# Patient Record
Sex: Male | Born: 1975 | Race: Black or African American | Hispanic: No | Marital: Single | Smoking: Current every day smoker
Health system: Southern US, Community
[De-identification: ages and names within clinical notes are randomized; demographics above are authoritative.]

## PROBLEM LIST (undated history)

## (undated) DIAGNOSIS — E119 Type 2 diabetes mellitus without complications: Secondary | ICD-10-CM

## (undated) DIAGNOSIS — J45909 Unspecified asthma, uncomplicated: Secondary | ICD-10-CM

## (undated) HISTORY — DX: Unspecified asthma, uncomplicated: J45.909

## (undated) HISTORY — DX: Type 2 diabetes mellitus without complications: E11.9

---

## 1998-10-23 ENCOUNTER — Emergency Department (HOSPITAL_COMMUNITY): Admission: EM | Admit: 1998-10-23 | Discharge: 1998-10-23 | Payer: Self-pay | Admitting: Emergency Medicine

## 1998-10-24 ENCOUNTER — Encounter: Payer: Self-pay | Admitting: Internal Medicine

## 2001-08-16 ENCOUNTER — Emergency Department (HOSPITAL_COMMUNITY): Admission: EM | Admit: 2001-08-16 | Discharge: 2001-08-16 | Payer: Self-pay

## 2014-01-02 ENCOUNTER — Telehealth: Payer: Self-pay | Admitting: Internal Medicine

## 2014-01-02 NOTE — Telephone Encounter (Signed)
Marteta Dick's son Shari Prowsvan and Waldemar DickensVan Featherly [phone], Van ClinesMarteta stated that she spoke with you and you agree to take both of her son to be new pt. Please verify this? Please call Marteta back ,

## 2014-01-03 NOTE — Telephone Encounter (Signed)
Ok Thx 

## 2014-01-08 NOTE — Telephone Encounter (Signed)
appt is set for both patient.

## 2014-03-24 ENCOUNTER — Other Ambulatory Visit (INDEPENDENT_AMBULATORY_CARE_PROVIDER_SITE_OTHER): Payer: 59

## 2014-03-24 ENCOUNTER — Ambulatory Visit (INDEPENDENT_AMBULATORY_CARE_PROVIDER_SITE_OTHER): Payer: 59 | Admitting: Internal Medicine

## 2014-03-24 ENCOUNTER — Encounter: Payer: Self-pay | Admitting: Internal Medicine

## 2014-03-24 ENCOUNTER — Telehealth: Payer: Self-pay | Admitting: Internal Medicine

## 2014-03-24 VITALS — BP 130/86 | HR 69 | Temp 98.3°F | Ht 72.0 in | Wt 255.0 lb

## 2014-03-24 DIAGNOSIS — R1013 Epigastric pain: Secondary | ICD-10-CM

## 2014-03-24 DIAGNOSIS — F17200 Nicotine dependence, unspecified, uncomplicated: Secondary | ICD-10-CM | POA: Insufficient documentation

## 2014-03-24 DIAGNOSIS — Z23 Encounter for immunization: Secondary | ICD-10-CM

## 2014-03-24 DIAGNOSIS — Z0000 Encounter for general adult medical examination without abnormal findings: Secondary | ICD-10-CM

## 2014-03-24 DIAGNOSIS — Z720 Tobacco use: Secondary | ICD-10-CM

## 2014-03-24 DIAGNOSIS — K30 Functional dyspepsia: Secondary | ICD-10-CM | POA: Insufficient documentation

## 2014-03-24 LAB — CBC WITH DIFFERENTIAL/PLATELET
Basophils Absolute: 0 10*3/uL (ref 0.0–0.1)
Basophils Relative: 0.5 % (ref 0.0–3.0)
EOS PCT: 5.4 % — AB (ref 0.0–5.0)
Eosinophils Absolute: 0.5 10*3/uL (ref 0.0–0.7)
HCT: 47.9 % (ref 39.0–52.0)
Hemoglobin: 15.7 g/dL (ref 13.0–17.0)
Lymphocytes Relative: 29 % (ref 12.0–46.0)
Lymphs Abs: 2.5 10*3/uL (ref 0.7–4.0)
MCHC: 32.8 g/dL (ref 30.0–36.0)
MCV: 80.9 fl (ref 78.0–100.0)
MONO ABS: 0.6 10*3/uL (ref 0.1–1.0)
Monocytes Relative: 7.3 % (ref 3.0–12.0)
NEUTROS PCT: 57.8 % (ref 43.0–77.0)
Neutro Abs: 5 10*3/uL (ref 1.4–7.7)
PLATELETS: 225 10*3/uL (ref 150.0–400.0)
RBC: 5.92 Mil/uL — AB (ref 4.22–5.81)
RDW: 12.6 % (ref 11.5–15.5)
WBC: 8.6 10*3/uL (ref 4.0–10.5)

## 2014-03-24 LAB — HEPATIC FUNCTION PANEL
ALT: 62 U/L — ABNORMAL HIGH (ref 0–53)
AST: 22 U/L (ref 0–37)
Albumin: 4.5 g/dL (ref 3.5–5.2)
Alkaline Phosphatase: 66 U/L (ref 39–117)
BILIRUBIN TOTAL: 0.4 mg/dL (ref 0.2–1.2)
Bilirubin, Direct: 0.1 mg/dL (ref 0.0–0.3)
Total Protein: 7.2 g/dL (ref 6.0–8.3)

## 2014-03-24 LAB — LIPID PANEL
Cholesterol: 174 mg/dL (ref 0–200)
HDL: 41 mg/dL (ref >39.00)
LDL Cholesterol: 99 mg/dL (ref 0–99)
NonHDL: 133
Total CHOL/HDL Ratio: 4
Triglycerides: 170 mg/dL — ABNORMAL HIGH (ref 0.0–149.0)
VLDL: 34 mg/dL (ref 0.0–40.0)

## 2014-03-24 LAB — URINALYSIS
Bilirubin Urine: NEGATIVE
HGB URINE DIPSTICK: NEGATIVE
Ketones, ur: NEGATIVE
LEUKOCYTES UA: NEGATIVE
Nitrite: NEGATIVE
SPECIFIC GRAVITY, URINE: 1.025 (ref 1.000–1.030)
Total Protein, Urine: NEGATIVE
Urine Glucose: NEGATIVE
Urobilinogen, UA: 0.2 (ref 0.0–1.0)
pH: 6.5 (ref 5.0–8.0)

## 2014-03-24 LAB — BASIC METABOLIC PANEL
BUN: 16 mg/dL (ref 6–23)
CO2: 28 mEq/L (ref 19–32)
Calcium: 9.7 mg/dL (ref 8.4–10.5)
Chloride: 104 mEq/L (ref 96–112)
Creatinine, Ser: 1.05 mg/dL (ref 0.40–1.50)
GFR: 100.61 mL/min (ref >60.00)
Glucose, Bld: 95 mg/dL (ref 70–99)
Potassium: 3.9 mEq/L (ref 3.5–5.1)
Sodium: 140 mEq/L (ref 135–145)

## 2014-03-24 LAB — H. PYLORI ANTIBODY, IGG: H Pylori IgG: NEGATIVE

## 2014-03-24 LAB — PSA: PSA: 0.65 ng/mL (ref 0.10–4.00)

## 2014-03-24 LAB — TSH: TSH: 1.7 u[IU]/mL (ref 0.35–4.50)

## 2014-03-24 MED ORDER — VITAMIN D 1000 UNITS PO TABS: 1000.0000 [IU] | ORAL_TABLET | Freq: Every day | ORAL | Status: AC

## 2014-03-24 NOTE — Telephone Encounter (Signed)
emmi mailed  °

## 2014-03-24 NOTE — Assessment & Plan Note (Signed)
Discussed.

## 2014-03-24 NOTE — Assessment & Plan Note (Signed)
H pylori

## 2014-03-24 NOTE — Patient Instructions (Signed)
Use OTC Prilosec for indigestion

## 2014-03-24 NOTE — Progress Notes (Signed)
Pre visit review using our clinic review tool, if applicable. No additional management support is needed unless otherwise documented below in the visit note. 

## 2014-03-24 NOTE — Assessment & Plan Note (Signed)
We discussed age appropriate health related issues, including available/recomended screening tests and vaccinations. We discussed a need for adhering to healthy diet and exercise. Labs/EKG were reviewed/ordered. All questions were answered.   

## 2015-03-27 ENCOUNTER — Other Ambulatory Visit (INDEPENDENT_AMBULATORY_CARE_PROVIDER_SITE_OTHER): Payer: 59

## 2015-03-27 ENCOUNTER — Encounter: Payer: Self-pay | Admitting: Internal Medicine

## 2015-03-27 ENCOUNTER — Ambulatory Visit (INDEPENDENT_AMBULATORY_CARE_PROVIDER_SITE_OTHER): Payer: 59 | Admitting: Internal Medicine

## 2015-03-27 VITALS — BP 112/82 | HR 85 | Ht 72.0 in | Wt 266.0 lb

## 2015-03-27 DIAGNOSIS — F17200 Nicotine dependence, unspecified, uncomplicated: Secondary | ICD-10-CM | POA: Diagnosis not present

## 2015-03-27 DIAGNOSIS — Z0000 Encounter for general adult medical examination without abnormal findings: Secondary | ICD-10-CM

## 2015-03-27 LAB — BASIC METABOLIC PANEL
BUN: 14 mg/dL (ref 6–23)
CALCIUM: 9.8 mg/dL (ref 8.4–10.5)
CHLORIDE: 103 meq/L (ref 96–112)
CO2: 28 meq/L (ref 19–32)
Creatinine, Ser: 1.15 mg/dL (ref 0.40–1.50)
GFR: 90.13 mL/min (ref >60.00)
Glucose, Bld: 107 mg/dL — ABNORMAL HIGH (ref 70–99)
Potassium: 4.6 mEq/L (ref 3.5–5.1)
SODIUM: 140 meq/L (ref 135–145)

## 2015-03-27 LAB — CBC WITH DIFFERENTIAL/PLATELET
BASOS ABS: 0 10*3/uL (ref 0.0–0.1)
BASOS PCT: 0.5 % (ref 0.0–3.0)
Eosinophils Absolute: 0.5 10*3/uL (ref 0.0–0.7)
Eosinophils Relative: 4.8 % (ref 0.0–5.0)
HEMATOCRIT: 49.5 % (ref 39.0–52.0)
Hemoglobin: 16.3 g/dL (ref 13.0–17.0)
LYMPHS PCT: 25 % (ref 12.0–46.0)
Lymphs Abs: 2.3 10*3/uL (ref 0.7–4.0)
MCHC: 32.8 g/dL (ref 30.0–36.0)
MCV: 80.1 fl (ref 78.0–100.0)
MONOS PCT: 7.9 % (ref 3.0–12.0)
Monocytes Absolute: 0.7 10*3/uL (ref 0.1–1.0)
NEUTROS ABS: 5.8 10*3/uL (ref 1.4–7.7)
Neutrophils Relative %: 61.8 % (ref 43.0–77.0)
PLATELETS: 243 10*3/uL (ref 150.0–400.0)
RBC: 6.19 Mil/uL — ABNORMAL HIGH (ref 4.22–5.81)
RDW: 13.1 % (ref 11.5–15.5)
WBC: 9.4 10*3/uL (ref 4.0–10.5)

## 2015-03-27 LAB — URINALYSIS
Bilirubin Urine: NEGATIVE
Hgb urine dipstick: NEGATIVE
Ketones, ur: NEGATIVE
Leukocytes, UA: NEGATIVE
NITRITE: NEGATIVE
SPECIFIC GRAVITY, URINE: 1.025 (ref 1.000–1.030)
Total Protein, Urine: NEGATIVE
URINE GLUCOSE: NEGATIVE
UROBILINOGEN UA: 0.2 (ref 0.0–1.0)
pH: 6 (ref 5.0–8.0)

## 2015-03-27 LAB — HEPATIC FUNCTION PANEL
ALBUMIN: 4.5 g/dL (ref 3.5–5.2)
ALT: 46 U/L (ref 0–53)
AST: 22 U/L (ref 0–37)
Alkaline Phosphatase: 67 U/L (ref 39–117)
Bilirubin, Direct: 0.1 mg/dL (ref 0.0–0.3)
Total Bilirubin: 0.4 mg/dL (ref 0.2–1.2)
Total Protein: 7.4 g/dL (ref 6.0–8.3)

## 2015-03-27 LAB — LIPID PANEL
CHOLESTEROL: 209 mg/dL — AB (ref 0–200)
HDL: 45.9 mg/dL (ref >39.00)
LDL Cholesterol: 134 mg/dL — ABNORMAL HIGH (ref 0–99)
NonHDL: 163.44
TRIGLYCERIDES: 148 mg/dL (ref 0.0–149.0)
Total CHOL/HDL Ratio: 5
VLDL: 29.6 mg/dL (ref 0.0–40.0)

## 2015-03-27 LAB — TSH: TSH: 1.17 u[IU]/mL (ref 0.35–4.50)

## 2015-03-27 LAB — PSA: PSA: 0.66 ng/mL (ref 0.10–4.00)

## 2015-03-27 MED ORDER — VITAMIN D3 50 MCG (2000 UT) PO CAPS: 2000.0000 [IU] | ORAL_CAPSULE | Freq: Every day | ORAL | Status: AC

## 2015-03-27 NOTE — Progress Notes (Signed)
Pre visit review using our clinic review tool, if applicable. No additional management support is needed unless otherwise documented below in the visit note. 

## 2015-03-27 NOTE — Progress Notes (Signed)
Subjective:  Patient ID: Levi Burns, male    DOB: 11-02-1975  Age: 40 y.o. MRN: [id number]  CC: Annual Exam   HPI Levi Burns presents for well exam  No outpatient prescriptions prior to visit.   No facility-administered medications prior to visit.    ROS Review of Systems  Constitutional: Negative for appetite change, fatigue and unexpected weight change.  HENT: Negative for congestion, nosebleeds, sneezing, sore throat and trouble swallowing.   Eyes: Negative for itching and visual disturbance.  Respiratory: Negative for cough.   Cardiovascular: Negative for chest pain, palpitations and leg swelling.  Gastrointestinal: Negative for nausea, diarrhea, blood in stool and abdominal distention.  Genitourinary: Negative for frequency and hematuria.  Musculoskeletal: Negative for back pain, joint swelling, gait problem and neck pain.  Skin: Negative for rash.  Neurological: Negative for dizziness, tremors, speech difficulty and weakness.  Psychiatric/Behavioral: Negative for suicidal ideas, sleep disturbance, dysphoric mood and agitation. The patient is not nervous/anxious.     Objective:  BP 112/82 mmHg  Pulse 85  Ht 6' (1.829 m)  Wt 266 lb (120.657 kg)  BMI 36.07 kg/m2  SpO2 97%  BP Readings from Last 3 Encounters:  03/27/15 112/82  03/24/14 130/86    Wt Readings from Last 3 Encounters:  03/27/15 266 lb (120.657 kg)  03/24/14 255 lb (115.667 kg)    Physical Exam  Constitutional: He is oriented to person, place, and time. He appears well-developed and well-nourished. No distress.  HENT:  Head: Normocephalic and atraumatic.  Right Ear: External ear normal.  Left Ear: External ear normal.  Nose: Nose normal.  Mouth/Throat: Oropharynx is clear and moist. No oropharyngeal exudate.  Eyes: Conjunctivae and EOM are normal. Pupils are equal, round, and reactive to light. Right eye exhibits no discharge. Left eye exhibits no discharge. No scleral icterus.  Neck: Normal  range of motion. Neck supple. No JVD present. No tracheal deviation present. No thyromegaly present.  Cardiovascular: Normal rate, regular rhythm, normal heart sounds and intact distal pulses.  Exam reveals no gallop and no friction rub.   No murmur heard. Pulmonary/Chest: Effort normal and breath sounds normal. No stridor. No respiratory distress. He has no wheezes. He has no rales. He exhibits no tenderness.  Abdominal: Soft. Bowel sounds are normal. He exhibits no distension and no mass. There is no tenderness. There is no rebound and no guarding.  Musculoskeletal: Normal range of motion. He exhibits no edema or tenderness.  Lymphadenopathy:    He has no cervical adenopathy.  Neurological: He is alert and oriented to person, place, and time. He has normal reflexes. No cranial nerve deficit. He exhibits normal muscle tone. Coordination normal.  Skin: Skin is warm and dry. No rash noted. He is not diaphoretic. No erythema. No pallor.  Psychiatric: He has a normal mood and affect. His behavior is normal. Judgment and thought content normal.    Lab Results  Component Value Date   WBC 8.6 03/24/2014   HGB 15.7 03/24/2014   HCT 47.9 03/24/2014   PLT 225.0 03/24/2014   GLUCOSE 95 03/24/2014   CHOL 174 03/24/2014   TRIG 170.0* 03/24/2014   HDL 41.00 03/24/2014   LDLCALC 99 03/24/2014   ALT 62* 03/24/2014   AST 22 03/24/2014   NA 140 03/24/2014   K 3.9 03/24/2014   CL 104 03/24/2014   CREATININE 1.05 03/24/2014   BUN 16 03/24/2014   CO2 28 03/24/2014   TSH 1.70 03/24/2014   PSA 0.65 03/24/2014  No results found.  Assessment & Plan:   There are no diagnoses linked to this encounter. Mr. Khim does not currently have medications on file.  No orders of the defined types were placed in this encounter.     Follow-up: No Follow-up on file.  Sonda Primes, MD

## 2015-03-27 NOTE — Assessment & Plan Note (Signed)
We discussed age appropriate health related issues, including available/recomended screening tests and vaccinations. We discussed a need for adhering to healthy diet and exercise. Labs/EKG were reviewed/ordered. All questions were answered.  Labs 

## 2015-03-27 NOTE — Assessment & Plan Note (Signed)
Discussed 1 ppd 

## 2015-03-28 LAB — HIV ANTIBODY (ROUTINE TESTING W REFLEX): HIV: NONREACTIVE

## 2016-03-28 ENCOUNTER — Encounter: Payer: Self-pay | Admitting: Internal Medicine

## 2016-03-28 ENCOUNTER — Ambulatory Visit (INDEPENDENT_AMBULATORY_CARE_PROVIDER_SITE_OTHER): Payer: 59 | Admitting: Internal Medicine

## 2016-03-28 ENCOUNTER — Other Ambulatory Visit (INDEPENDENT_AMBULATORY_CARE_PROVIDER_SITE_OTHER): Payer: 59

## 2016-03-28 VITALS — BP 120/80 | HR 66 | Ht 72.0 in | Wt 263.0 lb

## 2016-03-28 DIAGNOSIS — Z0000 Encounter for general adult medical examination without abnormal findings: Secondary | ICD-10-CM | POA: Diagnosis not present

## 2016-03-28 DIAGNOSIS — R972 Elevated prostate specific antigen [PSA]: Secondary | ICD-10-CM

## 2016-03-28 LAB — BASIC METABOLIC PANEL
BUN: 14 mg/dL (ref 6–23)
CHLORIDE: 106 meq/L (ref 96–112)
CO2: 27 meq/L (ref 19–32)
Calcium: 9.4 mg/dL (ref 8.4–10.5)
Creatinine, Ser: 1.13 mg/dL (ref 0.40–1.50)
GFR: 91.52 mL/min (ref >60.00)
GLUCOSE: 110 mg/dL — AB (ref 70–99)
POTASSIUM: 4.4 meq/L (ref 3.5–5.1)
Sodium: 141 mEq/L (ref 135–145)

## 2016-03-28 LAB — URINALYSIS
BILIRUBIN URINE: NEGATIVE
HGB URINE DIPSTICK: NEGATIVE
Ketones, ur: NEGATIVE
LEUKOCYTES UA: NEGATIVE
Nitrite: NEGATIVE
SPECIFIC GRAVITY, URINE: 1.02 (ref 1.000–1.030)
Total Protein, Urine: NEGATIVE
UROBILINOGEN UA: 0.2 (ref 0.0–1.0)
Urine Glucose: NEGATIVE
pH: 6 (ref 5.0–8.0)

## 2016-03-28 LAB — CBC WITH DIFFERENTIAL/PLATELET
BASOS PCT: 0.7 % (ref 0.0–3.0)
Basophils Absolute: 0.1 10*3/uL (ref 0.0–0.1)
EOS PCT: 4.9 % (ref 0.0–5.0)
Eosinophils Absolute: 0.4 10*3/uL (ref 0.0–0.7)
HCT: 45.9 % (ref 39.0–52.0)
HEMOGLOBIN: 15.6 g/dL (ref 13.0–17.0)
LYMPHS ABS: 2.6 10*3/uL (ref 0.7–4.0)
Lymphocytes Relative: 29 % (ref 12.0–46.0)
MCHC: 34.1 g/dL (ref 30.0–36.0)
MCV: 78.7 fl (ref 78.0–100.0)
Monocytes Absolute: 0.6 10*3/uL (ref 0.1–1.0)
Monocytes Relative: 6.9 % (ref 3.0–12.0)
NEUTROS PCT: 58.5 % (ref 43.0–77.0)
Neutro Abs: 5.3 10*3/uL (ref 1.4–7.7)
Platelets: 230 10*3/uL (ref 150.0–400.0)
RBC: 5.82 Mil/uL — ABNORMAL HIGH (ref 4.22–5.81)
RDW: 13.6 % (ref 11.5–15.5)
WBC: 9.1 10*3/uL (ref 4.0–10.5)

## 2016-03-28 LAB — HEPATIC FUNCTION PANEL
ALT: 41 U/L (ref 0–53)
AST: 20 U/L (ref 0–37)
Albumin: 4.3 g/dL (ref 3.5–5.2)
Alkaline Phosphatase: 66 U/L (ref 39–117)
BILIRUBIN TOTAL: 0.3 mg/dL (ref 0.2–1.2)
Bilirubin, Direct: 0.1 mg/dL (ref 0.0–0.3)
Total Protein: 7 g/dL (ref 6.0–8.3)

## 2016-03-28 LAB — PSA: PSA: 9.61 ng/mL — ABNORMAL HIGH (ref 0.10–4.00)

## 2016-03-28 LAB — LIPID PANEL
CHOLESTEROL: 181 mg/dL (ref 0–200)
HDL: 41.1 mg/dL (ref >39.00)
LDL CALC: 111 mg/dL — AB (ref 0–99)
NonHDL: 139.4
TRIGLYCERIDES: 143 mg/dL (ref 0.0–149.0)
Total CHOL/HDL Ratio: 4
VLDL: 28.6 mg/dL (ref 0.0–40.0)

## 2016-03-28 LAB — HIV ANTIBODY (ROUTINE TESTING W REFLEX): HIV: NONREACTIVE

## 2016-03-28 LAB — TSH: TSH: 1.11 u[IU]/mL (ref 0.35–4.50)

## 2016-03-28 NOTE — Progress Notes (Signed)
Pre visit review using our clinic review tool, if applicable. No additional management support is needed unless otherwise documented below in the visit note. 

## 2016-03-28 NOTE — Assessment & Plan Note (Signed)
We discussed age appropriate health related issues, including available/recomended screening tests and vaccinations. We discussed a need for adhering to healthy diet and exercise. Labs/EKG were reviewed/ordered. All questions were answered.   

## 2016-03-28 NOTE — Progress Notes (Signed)
Subjective:  Patient ID: Levi Burns, male    DOB: Mar 30, 1975  Age: 41 y.o. MRN: [id number]  CC: Annual Exam   HPI Levi Dronevan Roets presents for a well exam  Outpatient Medications Prior to Visit  Medication Sig Dispense Refill  . Cholecalciferol (VITAMIN D3) 2000 units capsule Take 1 capsule (2,000 Units total) by mouth daily. (Patient not taking: Reported on 03/28/2016) 100 capsule 3   No facility-administered medications prior to visit.     ROS Review of Systems  Constitutional: Negative for appetite change, fatigue and unexpected weight change.  HENT: Negative for congestion, nosebleeds, sneezing, sore throat and trouble swallowing.   Eyes: Negative for itching and visual disturbance.  Respiratory: Negative for cough.   Cardiovascular: Negative for chest pain, palpitations and leg swelling.  Gastrointestinal: Negative for abdominal distention, blood in stool, diarrhea and nausea.  Genitourinary: Negative for frequency and hematuria.  Musculoskeletal: Negative for back pain, gait problem, joint swelling and neck pain.  Skin: Negative for rash.  Neurological: Negative for dizziness, tremors, speech difficulty and weakness.  Psychiatric/Behavioral: Negative for agitation, dysphoric mood, sleep disturbance and suicidal ideas. The patient is not nervous/anxious.     Objective:  BP 120/80   Pulse 66   Ht 6' (1.829 m)   Wt 263 lb (119.3 kg)   SpO2 98%   BMI 35.67 kg/m   BP Readings from Last 3 Encounters:  03/28/16 120/80  03/27/15 112/82  03/24/14 130/86    Wt Readings from Last 3 Encounters:  03/28/16 263 lb (119.3 kg)  03/27/15 266 lb (120.7 kg)  03/24/14 255 lb (115.7 kg)    Physical Exam  Constitutional: He is oriented to person, place, and time. He appears well-developed. No distress.  NAD  HENT:  Mouth/Throat: Oropharynx is clear and moist.  Eyes: Conjunctivae are normal. Pupils are equal, round, and reactive to light.  Neck: Normal range of motion. No JVD  present. No thyromegaly present.  Cardiovascular: Normal rate, regular rhythm, normal heart sounds and intact distal pulses.  Exam reveals no gallop and no friction rub.   No murmur heard. Pulmonary/Chest: Effort normal and breath sounds normal. No respiratory distress. He has no wheezes. He has no rales. He exhibits no tenderness.  Abdominal: Soft. Bowel sounds are normal. He exhibits no distension and no mass. There is no tenderness. There is no rebound and no guarding.  Musculoskeletal: Normal range of motion. He exhibits no edema or tenderness.  Lymphadenopathy:    He has no cervical adenopathy.  Neurological: He is alert and oriented to person, place, and time. He has normal reflexes. No cranial nerve deficit. He exhibits normal muscle tone. He displays a negative Romberg sign. Coordination and gait normal.  Skin: Skin is warm and dry. No rash noted.  Psychiatric: He has a normal mood and affect. His behavior is normal. Judgment and thought content normal.  pt declined genital exam  Lab Results  Component Value Date   WBC 9.4 03/27/2015   HGB 16.3 03/27/2015   HCT 49.5 03/27/2015   PLT 243.0 03/27/2015   GLUCOSE 107 (H) 03/27/2015   CHOL 209 (H) 03/27/2015   TRIG 148.0 03/27/2015   HDL 45.90 03/27/2015   LDLCALC 134 (H) 03/27/2015   ALT 46 03/27/2015   AST 22 03/27/2015   NA 140 03/27/2015   K 4.6 03/27/2015   CL 103 03/27/2015   CREATININE 1.15 03/27/2015   BUN 14 03/27/2015   CO2 28 03/27/2015   TSH 1.17 03/27/2015   PSA  0.66 03/27/2015    No results found.  Assessment & Plan:   There are no diagnoses linked to this encounter. I am having Mr. Walczyk maintain his Vitamin D3 and multivitamin.  Meds ordered this encounter  Medications  . Multiple Vitamin (MULTIVITAMIN) tablet    Sig: Take 1 tablet by mouth daily.     Follow-up: No Follow-up on file.  Sonda Primes, MD

## 2016-03-30 MED ORDER — CIPROFLOXACIN HCL 500 MG PO TABS: 500.0000 mg | ORAL_TABLET | Freq: Two times a day (BID) | ORAL | 0 refills | Status: DC

## 2016-10-04 DIAGNOSIS — L738 Other specified follicular disorders: Secondary | ICD-10-CM | POA: Diagnosis not present

## 2016-10-04 DIAGNOSIS — L03211 Cellulitis of face: Secondary | ICD-10-CM | POA: Diagnosis not present

## 2016-11-23 ENCOUNTER — Ambulatory Visit (INDEPENDENT_AMBULATORY_CARE_PROVIDER_SITE_OTHER): Payer: 59 | Admitting: Urgent Care

## 2016-11-23 ENCOUNTER — Encounter: Payer: Self-pay | Admitting: Urgent Care

## 2016-11-23 VITALS — BP 158/94 | HR 91 | Temp 99.1°F | Resp 17 | Ht 73.5 in | Wt 267.0 lb

## 2016-11-23 DIAGNOSIS — L089 Local infection of the skin and subcutaneous tissue, unspecified: Secondary | ICD-10-CM | POA: Diagnosis not present

## 2016-11-23 DIAGNOSIS — R030 Elevated blood-pressure reading, without diagnosis of hypertension: Secondary | ICD-10-CM | POA: Diagnosis not present

## 2016-11-23 DIAGNOSIS — F17200 Nicotine dependence, unspecified, uncomplicated: Secondary | ICD-10-CM | POA: Diagnosis not present

## 2016-11-23 DIAGNOSIS — L739 Follicular disorder, unspecified: Secondary | ICD-10-CM | POA: Diagnosis not present

## 2016-11-23 MED ORDER — SULFAMETHOXAZOLE-TRIMETHOPRIM 800-160 MG PO TABS: 1.0000 | ORAL_TABLET | Freq: Two times a day (BID) | ORAL | 0 refills | Status: DC

## 2016-11-23 NOTE — Patient Instructions (Addendum)
Folliculitis Folliculitis is inflammation of the hair follicles. Folliculitis most commonly occurs on the scalp, thighs, legs, back, and buttocks. However, it can occur anywhere on the body. What are the causes? This condition may be caused by:  A bacterial infection (common).  A fungal infection.  A viral infection.  Coming into contact with certain chemicals, especially oils and tars.  Shaving or waxing.  Applying greasy ointments or creams to your skin often.  Long-lasting folliculitis and folliculitis that keeps coming back can be caused by bacteria that live in the nostrils. What increases the risk? This condition is more likely to develop in people with:  A weakened immune system.  Diabetes.  Obesity.  What are the signs or symptoms? Symptoms of this condition include:  Redness.  Soreness.  Swelling.  Itching.  Small white or yellow, pus-filled, itchy spots (pustules) that appear over a reddened area. If there is an infection that goes deep into the follicle, these may develop into a boil (furuncle).  A group of closely packed boils (carbuncle). These tend to form in hairy, sweaty areas of the body.  How is this diagnosed? This condition is diagnosed with a skin exam. To find what is causing the condition, your health care provider may take a sample of one of the pustules or boils for testing. How is this treated? This condition may be treated by:  Applying warm compresses to the affected areas.  Taking an antibiotic medicine or applying an antibiotic medicine to the skin.  Applying or bathing with an antiseptic solution.  Taking an over-the-counter medicine to help with itching.  Having a procedure to drain any pustules or boils. This may be done if a pustule or boil contains a lot of pus or fluid.  Laser hair removal. This may be done to treat long-lasting folliculitis.  Follow these instructions at home:  If directed, apply heat to the affected  area as often as told by your health care provider. Use the heat source that your health care provider recommends, such as a moist heat pack or a heating pad. ? Place a towel between your skin and the heat source. ? Leave the heat on for 20-30 minutes. ? Remove the heat if your skin turns bright red. This is especially important if you are unable to feel pain, heat, or cold. You may have a greater risk of getting burned.  If you were prescribed an antibiotic medicine, use it as told by your health care provider. Do not stop using the antibiotic even if you start to feel better.  Take over-the-counter and prescription medicines only as told by your health care provider.  Do not shave irritated skin.  Keep all follow-up visits as told by your health care provider. This is important. Get help right away if:  You have more redness, swelling, or pain in the affected area.  Red streaks are spreading from the affected area.  You have a fever. This information is not intended to replace advice given to you by your health care provider. Make sure you discuss any questions you have with your health care provider. Document Released: 05/02/2001 Document Revised: 09/11/2015 Document Reviewed: 12/12/2014 Elsevier Interactive Patient Education  2018 [employer].     Steps to Quit Smoking Smoking tobacco can be harmful to your health and can affect almost every organ in your body. Smoking puts you, and those around you, at risk for developing many serious chronic diseases. Quitting smoking is difficult, but it is one  of the best things that you can do for your health. It is never too late to quit. What are the benefits of quitting smoking? When you quit smoking, you lower your risk of developing serious diseases and conditions, such as:  Lung cancer or lung disease, such as COPD.  Heart disease.  Stroke.  Heart attack.  Infertility.  Osteoporosis and bone fractures.  Additionally,  symptoms such as coughing, wheezing, and shortness of breath may get better when you quit. You may also find that you get sick less often because your body is stronger at fighting off colds and infections. If you are pregnant, quitting smoking can help to reduce your chances of having a baby of low birth weight. How do I get ready to quit? When you decide to quit smoking, create a plan to make sure that you are successful. Before you quit:  Pick a date to quit. Set a date within the next two weeks to give you time to prepare.  Write down the reasons why you are quitting. Keep this list in places where you will see it often, such as on your bathroom mirror or in your car or wallet.  Identify the people, places, things, and activities that make you want to smoke (triggers) and avoid them. Make sure to take these actions: ? Throw away all cigarettes at home, at work, and in your car. ? Throw away smoking accessories, such as Set designer. ? Clean your car and make sure to empty the ashtray. ? Clean your home, including curtains and carpets.  Tell your family, friends, and coworkers that you are quitting. Support from your loved ones can make quitting easier.  Talk with your health care provider about your options for quitting smoking.  Find out what treatment options are covered by your health insurance.  What strategies can I use to quit smoking? Talk with your healthcare provider about different strategies to quit smoking. Some strategies include:  Quitting smoking altogether instead of gradually lessening how much you smoke over a period of time. Research shows that quitting "cold [place]" is more successful than gradually quitting.  Attending in-person counseling to help you build problem-solving skills. You are more likely to have success in quitting if you attend several counseling sessions. Even short sessions of 10 minutes can be effective.  Finding resources and support  systems that can help you to quit smoking and remain smoke-free after you quit. These resources are most helpful when you use them often. They can include: ? Online chats with a Veterinary surgeon. ? Telephone quitlines. ? Automotive engineer. ? Support groups or group counseling. ? Text messaging programs. ? Mobile phone applications.  Taking medicines to help you quit smoking. (If you are pregnant or breastfeeding, talk with your health care provider first.) Some medicines contain nicotine and some do not. Both types of medicines help with cravings, but the medicines that include nicotine help to relieve withdrawal symptoms. Your health care provider may recommend: ? Nicotine patches, gum, or lozenges. ? Nicotine inhalers or sprays. ? Non-nicotine medicine that is taken by mouth.  Talk with your health care provider about combining strategies, such as taking medicines while you are also receiving in-person counseling. Using these two strategies together makes you more likely to succeed in quitting than if you used either strategy on its own. If you are pregnant or breastfeeding, talk with your health care provider about finding counseling or other support strategies to quit smoking. Do not take medicine  to help you quit smoking unless told to do so by your health care provider. What things can I do to make it easier to quit? Quitting smoking might feel overwhelming at first, but there is a lot that you can do to make it easier. Take these important actions:  Reach out to your family and friends and ask that they support and encourage you during this time. Call telephone quitlines, reach out to support groups, or work with a counselor for support.  Ask people who smoke to avoid smoking around you.  Avoid places that trigger you to smoke, such as bars, parties, or smoke-break areas at work.  Spend time around people who do not smoke.  Lessen stress in your life, because stress can be a  smoking trigger for some people. To lessen stress, try: ? Exercising regularly. ? Deep-breathing exercises. ? Yoga. ? Meditating. ? Performing a body scan. This involves closing your eyes, scanning your body from head to toe, and noticing which parts of your body are particularly tense. Purposefully relax the muscles in those areas.  Download or purchase mobile phone or tablet apps (applications) that can help you stick to your quit plan by providing reminders, tips, and encouragement. There are many free apps, such as QuitGuide from the [employer] developer for Disease Control and Prevention). You can find other support for quitting smoking (smoking cessation) through smokefree.gov and other websites.  How will I feel when I quit smoking? Within the first 24 hours of quitting smoking, you may start to feel some withdrawal symptoms. These symptoms are usually most noticeable 2-3 days after quitting, but they usually do not last beyond 2-3 weeks. Changes or symptoms that you might experience include:  Mood swings.  Restlessness, anxiety, or irritation.  Difficulty concentrating.  Dizziness.  Strong cravings for sugary foods in addition to nicotine.  Mild weight gain.  Constipation.  Nausea.  Coughing or a sore throat.  Changes in how your medicines work in your body.  A depressed mood.  Difficulty sleeping (insomnia).  After the first 2-3 weeks of quitting, you may start to notice more positive results, such as:  Improved sense of smell and taste.  Decreased coughing and sore throat.  Slower heart rate.  Lower blood pressure.  Clearer skin.  The ability to breathe more easily.  Fewer sick days.  Quitting smoking is very challenging for most people. Do not get discouraged if you are not successful the first time. Some people need to make many attempts to quit before they achieve long-term success. Do your best to stick to your quit plan, and talk with your health care  provider if you have any questions or concerns. This information is not intended to replace advice given to you by your health care provider. Make sure you discuss any questions you have with your health care provider. Document Released: 02/15/2001 Document Revised: 10/20/2015 Document Reviewed: 07/08/2014 Elsevier Interactive Patient Education  2017 [employer].     IF you received an x-ray today, you will receive an invoice from [facility]. Please contact [facility] at [phone] with questions or concerns regarding your invoice.   IF you received labwork today, you will receive an invoice from [facility]. Please contact [facility] at [phone] with questions or concerns regarding your invoice.   Our billing staff will not be able to assist you with questions regarding bills from these companies.  You will be contacted with the lab results as soon as they are available. The fastest  way to get your results is to activate your My Chart account. Instructions are located on the last page of this paperwork. If you have not heard from [place] regarding the results in 2 weeks, please contact this office.

## 2016-11-23 NOTE — Progress Notes (Signed)
    MRN: [id number] DOB: 08-07-1975  Subjective:   Levi Burns is a 41 y.o. male presenting for chief complaint of Rash  Reports 2 week history of itchy rash. Patient used a dye on his beard and had to take Keflex ~1 month ago to resolve the issue. Denies fever, drainage of pus or bleeding, pain. Denies dizziness, chronic headache, blurred vision, chest pain, shortness of breath, heart racing, palpitations, nausea, vomiting, abdominal pain, hematuria, lower leg swelling. Smokes 1/2ppd.   Levi Burns has a current medication list which includes the following prescription(s): vitamin d3 and multivitamin. Also has No Known Allergies.  Levi Burns denies past medical and surgical history.   Objective:   Vitals: BP (!) 158/94   Pulse 91   Temp 99.1 F (37.3 C) (Oral)   Resp 17   Ht 6' 1.5" (1.867 m)   Wt 267 lb (121.1 kg)   SpO2 98%   BMI 34.75 kg/m   BP Readings from Last 3 Encounters:  11/23/16 (!) 158/94  03/28/16 120/80  03/27/15 112/82   Physical Exam  Constitutional: He is oriented to person, place, and time. He appears well-developed and well-nourished.  Cardiovascular: Normal rate.   Pulmonary/Chest: Effort normal.  Neurological: He is alert and oriented to person, place, and time.  Skin:      Assessment and Plan :   1. Superficial skin infection 2. Folliculitis - Start Bactrim, use warm compression over skin infection of left abdomen. Return-to-clinic precautions discussed, patient verbalized understanding.   3. Elevated blood pressure reading - Recheck in 4 weeks.  4. Tobacco use disorder - Counseled on smoking cessation. Offered topical and medical therapy. Patient will consider smoking cessation.  Wallis Bamberg, PA-C [facility] at [facility] [phone] 11/23/2016  2:13 PM

## 2017-03-28 ENCOUNTER — Encounter: Payer: 59 | Admitting: Internal Medicine

## 2017-03-28 DIAGNOSIS — Z0289 Encounter for other administrative examinations: Secondary | ICD-10-CM

## 2017-05-01 ENCOUNTER — Encounter: Payer: Self-pay | Admitting: Internal Medicine

## 2017-05-01 ENCOUNTER — Other Ambulatory Visit (INDEPENDENT_AMBULATORY_CARE_PROVIDER_SITE_OTHER): Payer: 59

## 2017-05-01 ENCOUNTER — Ambulatory Visit (INDEPENDENT_AMBULATORY_CARE_PROVIDER_SITE_OTHER): Payer: 59 | Admitting: Internal Medicine

## 2017-05-01 VITALS — BP 132/84 | HR 65 | Temp 98.3°F | Ht 73.5 in | Wt 259.0 lb

## 2017-05-01 DIAGNOSIS — K30 Functional dyspepsia: Secondary | ICD-10-CM | POA: Diagnosis not present

## 2017-05-01 DIAGNOSIS — Z0000 Encounter for general adult medical examination without abnormal findings: Secondary | ICD-10-CM | POA: Diagnosis not present

## 2017-05-01 DIAGNOSIS — E739 Lactose intolerance, unspecified: Secondary | ICD-10-CM | POA: Insufficient documentation

## 2017-05-01 LAB — BASIC METABOLIC PANEL
BUN: 19 mg/dL (ref 6–23)
CHLORIDE: 106 meq/L (ref 96–112)
CO2: 26 mEq/L (ref 19–32)
Calcium: 9.8 mg/dL (ref 8.4–10.5)
Creatinine, Ser: 1.28 mg/dL (ref 0.40–1.50)
GFR: 78.85 mL/min (ref >60.00)
Glucose, Bld: 116 mg/dL — ABNORMAL HIGH (ref 70–99)
Potassium: 4.4 mEq/L (ref 3.5–5.1)
Sodium: 144 mEq/L (ref 135–145)

## 2017-05-01 LAB — URINALYSIS
Bilirubin Urine: NEGATIVE
HGB URINE DIPSTICK: NEGATIVE
Ketones, ur: NEGATIVE
Leukocytes, UA: NEGATIVE
Nitrite: NEGATIVE
Specific Gravity, Urine: >=1.03 — AB (ref 1.000–1.030)
TOTAL PROTEIN, URINE-UPE24: NEGATIVE
Urine Glucose: NEGATIVE
Urobilinogen, UA: 0.2 (ref 0.0–1.0)
pH: 6 (ref 5.0–8.0)

## 2017-05-01 LAB — CBC WITH DIFFERENTIAL/PLATELET
BASOS PCT: 0.6 % (ref 0.0–3.0)
Basophils Absolute: 0 10*3/uL (ref 0.0–0.1)
EOS ABS: 0.3 10*3/uL (ref 0.0–0.7)
Eosinophils Relative: 4.2 % (ref 0.0–5.0)
HEMATOCRIT: 47.3 % (ref 39.0–52.0)
Hemoglobin: 16 g/dL (ref 13.0–17.0)
Lymphocytes Relative: 29.3 % (ref 12.0–46.0)
Lymphs Abs: 2.2 10*3/uL (ref 0.7–4.0)
MCHC: 33.8 g/dL (ref 30.0–36.0)
MCV: 79.2 fl (ref 78.0–100.0)
MONO ABS: 0.5 10*3/uL (ref 0.1–1.0)
Monocytes Relative: 7.2 % (ref 3.0–12.0)
NEUTROS ABS: 4.3 10*3/uL (ref 1.4–7.7)
Neutrophils Relative %: 58.7 % (ref 43.0–77.0)
PLATELETS: 233 10*3/uL (ref 150.0–400.0)
RBC: 5.97 Mil/uL — ABNORMAL HIGH (ref 4.22–5.81)
RDW: 14.1 % (ref 11.5–15.5)
WBC: 7.4 10*3/uL (ref 4.0–10.5)

## 2017-05-01 LAB — HEPATIC FUNCTION PANEL
ALT: 42 U/L (ref 0–53)
AST: 18 U/L (ref 0–37)
Albumin: 4.4 g/dL (ref 3.5–5.2)
Alkaline Phosphatase: 66 U/L (ref 39–117)
Bilirubin, Direct: 0 mg/dL (ref 0.0–0.3)
TOTAL PROTEIN: 7.3 g/dL (ref 6.0–8.3)
Total Bilirubin: 0.3 mg/dL (ref 0.2–1.2)

## 2017-05-01 LAB — LIPID PANEL
Cholesterol: 196 mg/dL (ref 0–200)
HDL: 39 mg/dL — AB (ref >39.00)
NonHDL: 156.92
Total CHOL/HDL Ratio: 5
Triglycerides: 204 mg/dL — ABNORMAL HIGH (ref 0.0–149.0)
VLDL: 40.8 mg/dL — AB (ref 0.0–40.0)

## 2017-05-01 LAB — PSA: PSA: 0.85 ng/mL (ref 0.10–4.00)

## 2017-05-01 LAB — TSH: TSH: 1.34 u[IU]/mL (ref 0.35–4.50)

## 2017-05-01 LAB — LDL CHOLESTEROL, DIRECT: Direct LDL: 113 mg/dL

## 2017-05-01 NOTE — Assessment & Plan Note (Signed)
Discussed.

## 2017-05-01 NOTE — Assessment & Plan Note (Signed)
Resolved

## 2017-05-01 NOTE — Assessment & Plan Note (Signed)
We discussed age appropriate health related issues, including available/recomended screening tests and vaccinations. We discussed a need for adhering to healthy diet and exercise. Labs were ordered to be later reviewed . All questions were answered.   

## 2017-05-01 NOTE — Patient Instructions (Signed)
Lactose Intolerance, Adult Lactose is the natural sugar found in milk and milk products, such as cheese and yogurt. Lactose is digested by lactase, an enzyme in your small intestine. Some people do not produce enough lactase to digest lactose. This is called lactose intolerance. Lactose intolerance is different from milk allergy, which is a more serious reaction to the protein in milk. What are the causes? Causes of lactose intolerance may include:  Normal aging. The ability to produce lactase may decline with age, causing lactose intolerance over time.  Being born without the ability to make lactase.  Digestive diseases such as gastroenteritis or inflammatory bowel disease.  Surgery or injuries to your small intestine.  Infection in your intestines.  Certain antibiotic medicines and cancer treatments.  What are the signs or symptoms? Lactose intolerance can cause uncomfortable symptoms. These are likely to occur within 30 minutes to 2 hours after eating or drinking foods containing lactose. Symptoms of lactose intolerance may include:  Nausea.  Diarrhea.  Abdominal cramps or pain.  Bloating.  Gas.  How is this diagnosed? There are several tests your health care provider can do to diagnose lactose intolerance. These tests include a hydrogen breath test and stool acidity test. How is this treated? No treatment can improve your body's ability to produce lactase. However, your symptoms can be controlled by limiting or avoiding milk products and other sources of lactose and adjusting your diet. Lactose-free milk is often tolerated. Lactose digestion may also be improved by adding lactase drops to regular milk or by taking lactase tablets when dairy products are consumed. Tolerance to lactose is individual. Some people may be able to eat or drink small amounts of products with lactose, while other may need to avoid lactose entirely. Talk to your health care provider about what is best  for you. Follow these instructions at home:  Limit or avoidfoods, beverages, and medicines containing lactose as directed by your health care provider.  Read food and medicine labels carefully to avoid products containing lactose, milk solids, casein, or whey.  If you eliminate dairy products, replace the protein, calcium, vitamin D, and other nutrients they contain through other foods. A registered dietitian or your health care provider can help you adjust your diet.  Choose a milk substitute that is fortified with calcium and vitamin D. Be aware that soy milk contains high quality protein, while milks made from nuts or grains contain very little protein.  Use lactase drops or tablets if directed by your health care provider. Contact a health care provider if: You have no relief from your symptoms after eliminating milk products and other sources of lactose. This information is not intended to replace advice given to you by your health care provider. Make sure you discuss any questions you have with your health care provider. Document Released: 02/21/2005 Document Revised: 07/30/2015 Document Reviewed: 05/24/2013 Elsevier Interactive Patient Education  2018 [employer]  

## 2017-05-01 NOTE — Progress Notes (Signed)
Subjective:  Patient ID: Levi Burns, male    DOB: 16-Jun-1975  Age: 42 y.o. MRN: [id number]  CC: No chief complaint on file.   HPI Levi Burns presents for a well exam. Lost wt loss  Outpatient Medications Prior to Visit  Medication Sig Dispense Refill  . Cholecalciferol (VITAMIN D3) 2000 units capsule Take 1 capsule (2,000 Units total) by mouth daily. 100 capsule 3  . Multiple Vitamin (MULTIVITAMIN) tablet Take 1 tablet by mouth daily.    Marland Kitchen sulfamethoxazole-trimethoprim (BACTRIM DS,SEPTRA DS) 800-160 MG tablet Take 1 tablet by mouth 2 (two) times daily. 20 tablet 0   No facility-administered medications prior to visit.     ROS Review of Systems  Constitutional: Negative for appetite change, fatigue and unexpected weight change.  HENT: Negative for congestion, nosebleeds, sneezing, sore throat and trouble swallowing.   Eyes: Negative for itching and visual disturbance.  Respiratory: Negative for cough.   Cardiovascular: Negative for chest pain, palpitations and leg swelling.  Gastrointestinal: Negative for abdominal distention, blood in stool, diarrhea and nausea.  Genitourinary: Negative for frequency and hematuria.  Musculoskeletal: Negative for back pain, gait problem, joint swelling and neck pain.  Skin: Negative for rash.  Neurological: Negative for dizziness, tremors, speech difficulty and weakness.  Psychiatric/Behavioral: Negative for agitation, dysphoric mood and sleep disturbance. The patient is not nervous/anxious.     Objective:  BP 132/84 (BP Location: Left Arm, Patient Position: Sitting, Cuff Size: Large)   Pulse 65   Temp 98.3 F (36.8 C) (Oral)   Ht 6' 1.5" (1.867 m)   Wt 259 lb (117.5 kg)   SpO2 99%   BMI 33.71 kg/m   BP Readings from Last 3 Encounters:  05/01/17 132/84  11/23/16 (!) 158/94  03/28/16 120/80    Wt Readings from Last 3 Encounters:  05/01/17 259 lb (117.5 kg)  11/23/16 267 lb (121.1 kg)  03/28/16 263 lb (119.3 kg)    Physical Exam   Constitutional: He is oriented to person, place, and time. He appears well-developed. No distress.  NAD  HENT:  Mouth/Throat: Oropharynx is clear and moist.  Eyes: Conjunctivae are normal. Pupils are equal, round, and reactive to light.  Neck: Normal range of motion. No JVD present. No thyromegaly present.  Cardiovascular: Normal rate, regular rhythm, normal heart sounds and intact distal pulses. Exam reveals no gallop and no friction rub.  No murmur heard. Pulmonary/Chest: Effort normal and breath sounds normal. No respiratory distress. He has no wheezes. He has no rales. He exhibits no tenderness.  Abdominal: Soft. Bowel sounds are normal. He exhibits no distension and no mass. There is no tenderness. There is no rebound and no guarding.  Musculoskeletal: Normal range of motion. He exhibits no edema or tenderness.  Lymphadenopathy:    He has no cervical adenopathy.  Neurological: He is alert and oriented to person, place, and time. He has normal reflexes. No cranial nerve deficit. He exhibits normal muscle tone. He displays a negative Romberg sign. Coordination and gait normal.  Skin: Skin is warm and dry. No rash noted.  Psychiatric: He has a normal mood and affect. His behavior is normal. Judgment and thought content normal.  B testes WNL  Lab Results  Component Value Date   WBC 9.1 03/28/2016   HGB 15.6 03/28/2016   HCT 45.9 03/28/2016   PLT 230.0 03/28/2016   GLUCOSE 110 (H) 03/28/2016   CHOL 181 03/28/2016   TRIG 143.0 03/28/2016   HDL 41.10 03/28/2016   LDLCALC 111 (H)  03/28/2016   ALT 41 03/28/2016   AST 20 03/28/2016   NA 141 03/28/2016   K 4.4 03/28/2016   CL 106 03/28/2016   CREATININE 1.13 03/28/2016   BUN 14 03/28/2016   CO2 27 03/28/2016   TSH 1.11 03/28/2016   PSA 9.61 Repeated and verified X2. (H) 03/28/2016    No results found.  Assessment & Plan:   There are no diagnoses linked to this encounter. I have discontinued Shari Prowsvan Bottino's  sulfamethoxazole-trimethoprim. I am also having him maintain his Vitamin D3 and multivitamin.  No orders of the defined types were placed in this encounter.    Follow-up: No Follow-up on file.  Sonda PrimesAlex Morse Brueggemann, MD

## 2017-05-02 LAB — HIV ANTIBODY (ROUTINE TESTING W REFLEX): HIV: NONREACTIVE

## 2018-05-07 ENCOUNTER — Encounter: Payer: Self-pay | Admitting: Internal Medicine

## 2018-05-07 ENCOUNTER — Other Ambulatory Visit (INDEPENDENT_AMBULATORY_CARE_PROVIDER_SITE_OTHER): Payer: 59

## 2018-05-07 ENCOUNTER — Ambulatory Visit (INDEPENDENT_AMBULATORY_CARE_PROVIDER_SITE_OTHER): Payer: 59 | Admitting: Internal Medicine

## 2018-05-07 VITALS — BP 130/82 | HR 82 | Temp 98.0°F | Ht 73.5 in | Wt 272.0 lb

## 2018-05-07 DIAGNOSIS — G4726 Circadian rhythm sleep disorder, shift work type: Secondary | ICD-10-CM | POA: Insufficient documentation

## 2018-05-07 DIAGNOSIS — Z0000 Encounter for general adult medical examination without abnormal findings: Secondary | ICD-10-CM | POA: Diagnosis not present

## 2018-05-07 DIAGNOSIS — R635 Abnormal weight gain: Secondary | ICD-10-CM

## 2018-05-07 DIAGNOSIS — F17200 Nicotine dependence, unspecified, uncomplicated: Secondary | ICD-10-CM

## 2018-05-07 DIAGNOSIS — E739 Lactose intolerance, unspecified: Secondary | ICD-10-CM

## 2018-05-07 LAB — BASIC METABOLIC PANEL
BUN: 16 mg/dL (ref 6–23)
CO2: 25 meq/L (ref 19–32)
Calcium: 9.3 mg/dL (ref 8.4–10.5)
Chloride: 103 mEq/L (ref 96–112)
Creatinine, Ser: 1.22 mg/dL (ref 0.40–1.50)
GFR: 78.04 mL/min (ref >60.00)
Glucose, Bld: 109 mg/dL — ABNORMAL HIGH (ref 70–99)
Potassium: 4.2 mEq/L (ref 3.5–5.1)
Sodium: 138 mEq/L (ref 135–145)

## 2018-05-07 LAB — URINALYSIS
Bilirubin Urine: NEGATIVE
Hgb urine dipstick: NEGATIVE
Ketones, ur: NEGATIVE
LEUKOCYTE UA: NEGATIVE
Nitrite: NEGATIVE
Specific Gravity, Urine: 1.025 (ref 1.000–1.030)
Total Protein, Urine: NEGATIVE
Urine Glucose: NEGATIVE
Urobilinogen, UA: 0.2 (ref 0.0–1.0)
pH: 6 (ref 5.0–8.0)

## 2018-05-07 LAB — TESTOSTERONE: TESTOSTERONE: 314.98 ng/dL (ref 300.00–890.00)

## 2018-05-07 LAB — LIPID PANEL
Cholesterol: 169 mg/dL (ref 0–200)
HDL: 37.8 mg/dL — ABNORMAL LOW (ref >39.00)
LDL Cholesterol: 100 mg/dL — ABNORMAL HIGH (ref 0–99)
NonHDL: 131.35
Total CHOL/HDL Ratio: 4
Triglycerides: 155 mg/dL — ABNORMAL HIGH (ref 0.0–149.0)
VLDL: 31 mg/dL (ref 0.0–40.0)

## 2018-05-07 LAB — HEPATIC FUNCTION PANEL
ALK PHOS: 58 U/L (ref 39–117)
ALT: 52 U/L (ref 0–53)
AST: 26 U/L (ref 0–37)
Albumin: 4.5 g/dL (ref 3.5–5.2)
Bilirubin, Direct: 0 mg/dL (ref 0.0–0.3)
Total Bilirubin: 0.4 mg/dL (ref 0.2–1.2)
Total Protein: 7.2 g/dL (ref 6.0–8.3)

## 2018-05-07 LAB — CBC WITH DIFFERENTIAL/PLATELET
Basophils Absolute: 0.1 10*3/uL (ref 0.0–0.1)
Basophils Relative: 0.7 % (ref 0.0–3.0)
Eosinophils Absolute: 1.2 10*3/uL — ABNORMAL HIGH (ref 0.0–0.7)
Eosinophils Relative: 12.3 % — ABNORMAL HIGH (ref 0.0–5.0)
HCT: 47.4 % (ref 39.0–52.0)
Hemoglobin: 16.1 g/dL (ref 13.0–17.0)
Lymphocytes Relative: 29 % (ref 12.0–46.0)
Lymphs Abs: 2.8 10*3/uL (ref 0.7–4.0)
MCHC: 33.9 g/dL (ref 30.0–36.0)
MCV: 79.2 fl (ref 78.0–100.0)
Monocytes Absolute: 0.7 10*3/uL (ref 0.1–1.0)
Monocytes Relative: 7.5 % (ref 3.0–12.0)
Neutro Abs: 5 10*3/uL (ref 1.4–7.7)
Neutrophils Relative %: 50.5 % (ref 43.0–77.0)
Platelets: 242 10*3/uL (ref 150.0–400.0)
RBC: 5.99 Mil/uL — ABNORMAL HIGH (ref 4.22–5.81)
RDW: 13.3 % (ref 11.5–15.5)
WBC: 9.8 10*3/uL (ref 4.0–10.5)

## 2018-05-07 LAB — PSA: PSA: 0.59 ng/mL (ref 0.10–4.00)

## 2018-05-07 LAB — TSH: TSH: 1.7 u[IU]/mL (ref 0.35–4.50)

## 2018-05-07 MED ORDER — ALPRAZOLAM 0.5 MG PO TBDP: 0.5000 mg | ORAL_TABLET | Freq: Every day | ORAL | 0 refills | Status: DC | PRN

## 2018-05-07 NOTE — Patient Instructions (Addendum)
Weighted blanket 16-20 lbs Valerian root as needed  Wt Readings from Last 3 Encounters:  05/07/18 272 lb (123.4 kg)  05/01/17 259 lb (117.5 kg)  11/23/16 267 lb (121.1 kg)

## 2018-05-07 NOTE — Assessment & Plan Note (Signed)
Pt declined shift work meds 3/20 Weighted blanket Valerian root as needed

## 2018-05-07 NOTE — Assessment & Plan Note (Signed)
Lactose free diet

## 2018-05-07 NOTE — Assessment & Plan Note (Signed)
Discussed 1 ppd 

## 2018-05-07 NOTE — Progress Notes (Signed)
Subjective:  Patient ID: Levi Burns, male    DOB: 1975-11-27  Age: 43 y.o. MRN: [id number]  CC: No chief complaint on file.   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 Cholecalciferol (VITAMIN D3) 2000 units capsule Take 1 capsule (2,000 Units total) by mouth daily. 100 capsule 3  . Multiple Vitamin (MULTIVITAMIN) tablet Take 1 tablet by mouth daily.     No facility-administered medications prior to visit.     ROS: Review of Systems  Constitutional: Positive for fatigue. Negative for appetite change and unexpected weight change.  HENT: Negative for congestion, nosebleeds, sneezing, sore throat and trouble swallowing.   Eyes: Negative for itching and visual disturbance.  Respiratory: Negative for cough.   Cardiovascular: Negative for chest pain, palpitations and leg swelling.  Gastrointestinal: Negative for abdominal distention, blood in stool, diarrhea and nausea.  Genitourinary: Negative for frequency and hematuria.  Musculoskeletal: Negative for back pain, gait problem, joint swelling and neck pain.  Skin: Negative for rash.  Neurological: Negative for dizziness, tremors, speech difficulty and weakness.  Psychiatric/Behavioral: Negative for agitation, dysphoric mood, sleep disturbance and suicidal ideas. The patient is not nervous/anxious.     Objective:  BP 130/82 (BP Location: Right Arm, Patient Position: Sitting, Cuff Size: Large)   Pulse 82   Temp 98 F (36.7 C) (Oral)   Ht 6' 1.5" (1.867 m)   Wt 272 lb (123.4 kg)   SpO2 97%   BMI 35.40 kg/m   BP Readings from Last 3 Encounters:  05/07/18 130/82  05/01/17 132/84  11/23/16 (!) 158/94    Wt Readings from Last 3 Encounters:  05/07/18 272 lb (123.4 kg)  05/01/17 259 lb (117.5 kg)  11/23/16 267 lb (121.1 kg)    Physical Exam Constitutional:      General: He is not in  acute distress.    Appearance: He is well-developed.     Comments: NAD  Eyes:     Conjunctiva/sclera: Conjunctivae normal.     Pupils: Pupils are equal, round, and reactive to light.  Neck:     Musculoskeletal: Normal range of motion.     Thyroid: No thyromegaly.     Vascular: No JVD.  Cardiovascular:     Rate and Rhythm: Normal rate and regular rhythm.     Heart sounds: Normal heart sounds. No murmur. No friction rub. No gallop.   Pulmonary:     Effort: Pulmonary effort is normal. No respiratory distress.     Breath sounds: Normal breath sounds. No wheezing or rales.  Chest:     Chest wall: No tenderness.  Abdominal:     General: Bowel sounds are normal. There is no distension.     Palpations: Abdomen is soft. There is no mass.     Tenderness: There is no abdominal tenderness. There is no guarding or rebound.  Musculoskeletal: Normal range of motion.        General: No tenderness.  Lymphadenopathy:     Cervical: No cervical adenopathy.  Skin:    General: Skin is warm and dry.     Findings: No rash.  Neurological:     Mental Status: He is alert and oriented to person, place, and time.     Cranial Nerves: No cranial nerve deficit.     Motor: No abnormal muscle tone.     Coordination: Coordination  normal.     Gait: Gait normal.     Deep Tendon Reflexes: Reflexes are normal and symmetric.  Psychiatric:        Behavior: Behavior normal.        Thought Content: Thought content normal.        Judgment: Judgment normal.    genital exam - pt declined   Lab Results  Component Value Date   WBC 7.4 05/01/2017   HGB 16.0 05/01/2017   HCT 47.3 05/01/2017   PLT 233.0 05/01/2017   GLUCOSE 116 (H) 05/01/2017   CHOL 196 05/01/2017   TRIG 204.0 (H) 05/01/2017   HDL 39.00 (L) 05/01/2017   LDLDIRECT 113.0 05/01/2017   LDLCALC 111 (H) 03/28/2016   ALT 42 05/01/2017   AST 18 05/01/2017   NA 144 05/01/2017   K 4.4 05/01/2017   CL 106 05/01/2017   CREATININE 1.28 05/01/2017    BUN 19 05/01/2017   CO2 26 05/01/2017   TSH 1.34 05/01/2017   PSA 0.85 05/01/2017    No results found.  Assessment & Plan:   There are no diagnoses linked to this encounter.   No orders of the defined types were placed in this encounter.    Follow-up: No follow-ups on file.  Sonda Primes, MD

## 2019-03-08 HISTORY — PX: WISDOM TOOTH EXTRACTION: SHX21

## 2019-05-13 ENCOUNTER — Ambulatory Visit (INDEPENDENT_AMBULATORY_CARE_PROVIDER_SITE_OTHER): Payer: 59 | Admitting: Internal Medicine

## 2019-05-13 ENCOUNTER — Encounter: Payer: Self-pay | Admitting: Internal Medicine

## 2019-05-13 ENCOUNTER — Other Ambulatory Visit: Payer: Self-pay

## 2019-05-13 DIAGNOSIS — M25561 Pain in right knee: Secondary | ICD-10-CM

## 2019-05-13 DIAGNOSIS — Z0000 Encounter for general adult medical examination without abnormal findings: Secondary | ICD-10-CM | POA: Diagnosis not present

## 2019-05-13 DIAGNOSIS — F17200 Nicotine dependence, unspecified, uncomplicated: Secondary | ICD-10-CM | POA: Diagnosis not present

## 2019-05-13 LAB — URINALYSIS
Bilirubin Urine: NEGATIVE
Hgb urine dipstick: NEGATIVE
Ketones, ur: NEGATIVE
Leukocytes,Ua: NEGATIVE
Nitrite: NEGATIVE
Specific Gravity, Urine: 1.025 (ref 1.000–1.030)
Total Protein, Urine: NEGATIVE
Urine Glucose: NEGATIVE
Urobilinogen, UA: 0.2 (ref 0.0–1.0)
pH: 6 (ref 5.0–8.0)

## 2019-05-13 LAB — BASIC METABOLIC PANEL
BUN: 16 mg/dL (ref 6–23)
CO2: 28 mEq/L (ref 19–32)
Calcium: 9.6 mg/dL (ref 8.4–10.5)
Chloride: 105 mEq/L (ref 96–112)
Creatinine, Ser: 1.13 mg/dL (ref 0.40–1.50)
GFR: 84.86 mL/min (ref >60.00)
Glucose, Bld: 124 mg/dL — ABNORMAL HIGH (ref 70–99)
Potassium: 4.3 mEq/L (ref 3.5–5.1)
Sodium: 140 mEq/L (ref 135–145)

## 2019-05-13 LAB — CBC WITH DIFFERENTIAL/PLATELET
Basophils Absolute: 0.1 10*3/uL (ref 0.0–0.1)
Basophils Relative: 0.9 % (ref 0.0–3.0)
Eosinophils Absolute: 0.3 10*3/uL (ref 0.0–0.7)
Eosinophils Relative: 3.2 % (ref 0.0–5.0)
HCT: 46.9 % (ref 39.0–52.0)
Hemoglobin: 15.6 g/dL (ref 13.0–17.0)
Lymphocytes Relative: 27 % (ref 12.0–46.0)
Lymphs Abs: 2.3 10*3/uL (ref 0.7–4.0)
MCHC: 33.3 g/dL (ref 30.0–36.0)
MCV: 79.9 fl (ref 78.0–100.0)
Monocytes Absolute: 0.6 10*3/uL (ref 0.1–1.0)
Monocytes Relative: 7.6 % (ref 3.0–12.0)
Neutro Abs: 5.3 10*3/uL (ref 1.4–7.7)
Neutrophils Relative %: 61.3 % (ref 43.0–77.0)
Platelets: 246 10*3/uL (ref 150.0–400.0)
RBC: 5.87 Mil/uL — ABNORMAL HIGH (ref 4.22–5.81)
RDW: 13.9 % (ref 11.5–15.5)
WBC: 8.6 10*3/uL (ref 4.0–10.5)

## 2019-05-13 LAB — LIPID PANEL
Cholesterol: 174 mg/dL (ref 0–200)
HDL: 37.5 mg/dL — ABNORMAL LOW (ref >39.00)
LDL Cholesterol: 103 mg/dL — ABNORMAL HIGH (ref 0–99)
NonHDL: 136.13
Total CHOL/HDL Ratio: 5
Triglycerides: 165 mg/dL — ABNORMAL HIGH (ref 0.0–149.0)
VLDL: 33 mg/dL (ref 0.0–40.0)

## 2019-05-13 LAB — HEPATIC FUNCTION PANEL
ALT: 33 U/L (ref 0–53)
AST: 17 U/L (ref 0–37)
Albumin: 4.3 g/dL (ref 3.5–5.2)
Alkaline Phosphatase: 65 U/L (ref 39–117)
Bilirubin, Direct: 0.1 mg/dL (ref 0.0–0.3)
Total Bilirubin: 0.4 mg/dL (ref 0.2–1.2)
Total Protein: 7.1 g/dL (ref 6.0–8.3)

## 2019-05-13 LAB — PSA: PSA: 0.51 ng/mL (ref 0.10–4.00)

## 2019-05-13 LAB — TSH: TSH: 0.78 u[IU]/mL (ref 0.35–4.50)

## 2019-05-13 NOTE — Progress Notes (Signed)
Subjective:  Patient ID: Levi Burns, male    DOB: [birth year]  Age: 44 y.o. MRN: [id number]  CC: No chief complaint on file.   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 ALPRAZolam (NIRAVAM) 0.5 MG dissolvable tablet Take 1-2 tablets (0.5-1 mg total) by mouth daily as needed for anxiety (prior to dental work). 20 tablet 0  . Cholecalciferol (VITAMIN D3) 2000 units capsule Take 1 capsule (2,000 Units total) by mouth daily. 100 capsule 3  . Multiple Vitamin (MULTIVITAMIN) tablet Take 1 tablet by mouth daily.     No facility-administered medications prior to visit.    ROS: Review of Systems  Constitutional: Negative for appetite change, fatigue and unexpected weight change.  HENT: Negative for congestion, nosebleeds, sneezing, sore throat and trouble swallowing.   Eyes: Negative for itching and visual disturbance.  Respiratory: Negative for cough.   Cardiovascular: Negative for chest pain, palpitations and leg swelling.  Gastrointestinal: Negative for abdominal distention, blood in stool, diarrhea and nausea.  Genitourinary: Negative for frequency and hematuria.  Musculoskeletal: Positive for arthralgias. Negative for back pain, gait problem, joint swelling and neck pain.  Skin: Negative for rash.  Neurological: Negative for dizziness, tremors, speech difficulty and weakness.  Psychiatric/Behavioral: Negative for agitation, dysphoric mood and sleep disturbance. The patient is not nervous/anxious.     Objective:  BP 130/84 (BP Location: Left Arm, Patient Position: Sitting, Cuff Size: Large)   Pulse 83   Temp 98.5 F (36.9 C) (Oral)   Ht 6' 1.5" (1.867 m)   Wt 271 lb (122.9 kg)   SpO2 99%   BMI 35.27 kg/m   BP Readings from Last 3 Encounters:  05/13/19 130/84  05/07/18 130/82  05/01/17 132/84    Wt Readings from Last 3 Encounters:  05/13/19 271 lb  (122.9 kg)  05/07/18 272 lb (123.4 kg)  05/01/17 259 lb (117.5 kg)    Physical Exam Constitutional:      General: He is not in acute distress.    Appearance: He is well-developed.     Comments: NAD  Eyes:     Conjunctiva/sclera: Conjunctivae normal.     Pupils: Pupils are equal, round, and reactive to light.  Neck:     Thyroid: No thyromegaly.     Vascular: No JVD.  Cardiovascular:     Rate and Rhythm: Normal rate and regular rhythm.     Heart sounds: Normal heart sounds. No murmur. No friction rub. No gallop.   Pulmonary:     Effort: Pulmonary effort is normal. No respiratory distress.     Breath sounds: Normal breath sounds. No wheezing or rales.  Chest:     Chest wall: No tenderness.  Abdominal:     General: Bowel sounds are normal. There is no distension.     Palpations: Abdomen is soft. There is no mass.     Tenderness: There is no abdominal tenderness. There is no guarding or rebound.  Musculoskeletal:        General: No tenderness. Normal range of motion.     Cervical back: Normal range of motion.  Lymphadenopathy:     Cervical: No cervical adenopathy.  Skin:    General: Skin is warm and dry.     Findings: No rash.  Neurological:     Mental Status: He is alert and oriented to person, place, and time.  Cranial Nerves: No cranial nerve deficit.     Motor: No abnormal muscle tone.     Coordination: Coordination normal.     Gait: Gait normal.     Deep Tendon Reflexes: Reflexes are normal and symmetric.  Psychiatric:        Behavior: Behavior normal.        Thought Content: Thought content normal.        Judgment: Judgment normal.    R knee w/pain Lab Results  Component Value Date   WBC 9.8 05/07/2018   HGB 16.1 05/07/2018   HCT 47.4 05/07/2018   PLT 242.0 05/07/2018   GLUCOSE 109 (H) 05/07/2018   CHOL 169 05/07/2018   TRIG 155.0 (H) 05/07/2018   HDL 37.80 (L) 05/07/2018   LDLDIRECT 113.0 05/01/2017   LDLCALC 100 (H) 05/07/2018   ALT 52 05/07/2018    AST 26 05/07/2018   NA 138 05/07/2018   K 4.2 05/07/2018   CL 103 05/07/2018   CREATININE 1.22 05/07/2018   BUN 16 05/07/2018   CO2 25 05/07/2018   TSH 1.70 05/07/2018   PSA 0.59 05/07/2018   Testes - self exam No results found.  Assessment & Plan:   There are no diagnoses linked to this encounter.   No orders of the defined types were placed in this encounter.    Follow-up: No follow-ups on file.  Walker Kehr, MD

## 2019-05-13 NOTE — Assessment & Plan Note (Signed)
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

## 2019-05-13 NOTE — Assessment & Plan Note (Signed)
Discussed.

## 2019-05-13 NOTE — Assessment & Plan Note (Signed)
We discussed age appropriate health related issues, including available/recomended screening tests and vaccinations. We discussed a need for adhering to healthy diet and exercise. Labs were ordered to be later reviewed . All questions were answered.   

## 2019-05-14 LAB — HIV ANTIBODY (ROUTINE TESTING W REFLEX): HIV 1&2 Ab, 4th Generation: NONREACTIVE

## 2020-05-18 ENCOUNTER — Encounter: Payer: 59 | Admitting: Internal Medicine

## 2020-05-29 ENCOUNTER — Other Ambulatory Visit: Payer: Self-pay

## 2020-06-01 ENCOUNTER — Ambulatory Visit (INDEPENDENT_AMBULATORY_CARE_PROVIDER_SITE_OTHER): Payer: 59 | Admitting: Internal Medicine

## 2020-06-01 ENCOUNTER — Encounter: Payer: Self-pay | Admitting: Internal Medicine

## 2020-06-01 ENCOUNTER — Other Ambulatory Visit: Payer: Self-pay

## 2020-06-01 VITALS — BP 132/88 | HR 91 | Temp 98.0°F | Ht 73.5 in | Wt 273.2 lb

## 2020-06-01 DIAGNOSIS — Z0000 Encounter for general adult medical examination without abnormal findings: Secondary | ICD-10-CM | POA: Diagnosis not present

## 2020-06-01 DIAGNOSIS — F17200 Nicotine dependence, unspecified, uncomplicated: Secondary | ICD-10-CM

## 2020-06-01 DIAGNOSIS — Z125 Encounter for screening for malignant neoplasm of prostate: Secondary | ICD-10-CM

## 2020-06-01 LAB — COMPREHENSIVE METABOLIC PANEL
ALT: 33 U/L (ref 0–53)
AST: 21 U/L (ref 0–37)
Albumin: 4.5 g/dL (ref 3.5–5.2)
Alkaline Phosphatase: 68 U/L (ref 39–117)
BUN: 15 mg/dL (ref 6–23)
CO2: 26 mEq/L (ref 19–32)
Calcium: 9.4 mg/dL (ref 8.4–10.5)
Chloride: 103 mEq/L (ref 96–112)
Creatinine, Ser: 1.07 mg/dL (ref 0.40–1.50)
GFR: 83.41 mL/min (ref >60.00)
Glucose, Bld: 131 mg/dL — ABNORMAL HIGH (ref 70–99)
Potassium: 4.5 mEq/L (ref 3.5–5.1)
Sodium: 137 mEq/L (ref 135–145)
Total Bilirubin: 0.4 mg/dL (ref 0.2–1.2)
Total Protein: 7.1 g/dL (ref 6.0–8.3)

## 2020-06-01 LAB — URINALYSIS
Bilirubin Urine: NEGATIVE
Hgb urine dipstick: NEGATIVE
Ketones, ur: NEGATIVE
Leukocytes,Ua: NEGATIVE
Nitrite: NEGATIVE
Specific Gravity, Urine: 1.02 (ref 1.000–1.030)
Urine Glucose: NEGATIVE
Urobilinogen, UA: 0.2 (ref 0.0–1.0)
pH: 6 (ref 5.0–8.0)

## 2020-06-01 LAB — CBC WITH DIFFERENTIAL/PLATELET
Basophils Absolute: 0.1 10*3/uL (ref 0.0–0.1)
Basophils Relative: 0.8 % (ref 0.0–3.0)
Eosinophils Absolute: 0.3 10*3/uL (ref 0.0–0.7)
Eosinophils Relative: 3.5 % (ref 0.0–5.0)
HCT: 44.6 % (ref 39.0–52.0)
Hemoglobin: 15.1 g/dL (ref 13.0–17.0)
Lymphocytes Relative: 26.3 % (ref 12.0–46.0)
Lymphs Abs: 2.2 10*3/uL (ref 0.7–4.0)
MCHC: 33.8 g/dL (ref 30.0–36.0)
MCV: 78 fl (ref 78.0–100.0)
Monocytes Absolute: 0.7 10*3/uL (ref 0.1–1.0)
Monocytes Relative: 7.9 % (ref 3.0–12.0)
Neutro Abs: 5.1 10*3/uL (ref 1.4–7.7)
Neutrophils Relative %: 61.5 % (ref 43.0–77.0)
Platelets: 251 10*3/uL (ref 150.0–400.0)
RBC: 5.71 Mil/uL (ref 4.22–5.81)
RDW: 13.4 % (ref 11.5–15.5)
WBC: 8.2 10*3/uL (ref 4.0–10.5)

## 2020-06-01 LAB — LIPID PANEL
Cholesterol: 175 mg/dL (ref 0–200)
HDL: 35.5 mg/dL — ABNORMAL LOW (ref >39.00)
LDL Cholesterol: 114 mg/dL — ABNORMAL HIGH (ref 0–99)
NonHDL: 139.65
Total CHOL/HDL Ratio: 5
Triglycerides: 128 mg/dL (ref 0.0–149.0)
VLDL: 25.6 mg/dL (ref 0.0–40.0)

## 2020-06-01 LAB — PSA: PSA: 0.72 ng/mL (ref 0.10–4.00)

## 2020-06-01 LAB — TSH: TSH: 1.44 u[IU]/mL (ref 0.35–4.50)

## 2020-06-01 NOTE — Addendum Note (Signed)
Addended by: Merrilyn Puma on: 06/01/2020 11:06 AM   Modules accepted: Orders

## 2020-06-01 NOTE — Assessment & Plan Note (Signed)
Discussed  1/2 ppd A cardiac CT scan for coronary calcium offered 3/22

## 2020-06-01 NOTE — Patient Instructions (Signed)

## 2020-06-01 NOTE — Assessment & Plan Note (Addendum)
We discussed age appropriate health related issues, including available/recomended screening tests and vaccinations. Labs were ordered to be later reviewed . All questions were answered. We discussed one or more of the following - seat belt use, use of sunscreen/sun exposure exercise, safe sex, fall risk reduction, second hand smoke exposure, firearm use and storage, seat belt use, a need for adhering to healthy diet and exercise. Labs were ordered.  All questions were answered. Condoms HIV Discussed smoking A cardiac CT scan for coronary calcium offered

## 2020-06-01 NOTE — Progress Notes (Signed)
Subjective:  Patient ID: Levi Burns, male    DOB: 1975/08/22  Age: 45 y.o. MRN: [id number]  CC: Annual Exam   HPI Levi Burns presents for a well exam  Outpatient Medications Prior to Visit  Medication Sig Dispense Refill  . Cholecalciferol (VITAMIN D3) 2000 units capsule Take 1 capsule (2,000 Units total) by mouth daily. 100 capsule 3  . Multiple Vitamin (MULTIVITAMIN) tablet Take 1 tablet by mouth daily.    Marland Kitchen ALPRAZolam (NIRAVAM) 0.5 MG dissolvable tablet Take 1-2 tablets (0.5-1 mg total) by mouth daily as needed for anxiety (prior to dental work). (Patient not taking: Reported on 06/01/2020) 20 tablet 0   No facility-administered medications prior to visit.    ROS: Review of Systems  Constitutional: Negative for appetite change, fatigue and unexpected weight change.  HENT: Negative for congestion, nosebleeds, sneezing, sore throat and trouble swallowing.   Eyes: Negative for itching and visual disturbance.  Respiratory: Negative for cough.   Cardiovascular: Negative for chest pain, palpitations and leg swelling.  Gastrointestinal: Negative for abdominal distention, blood in stool, diarrhea and nausea.  Genitourinary: Negative for frequency and hematuria.  Musculoskeletal: Negative for back pain, gait problem, joint swelling and neck pain.  Skin: Negative for rash.  Neurological: Negative for dizziness, tremors, speech difficulty and weakness.  Psychiatric/Behavioral: Negative for agitation, dysphoric mood, sleep disturbance and suicidal ideas. The patient is not nervous/anxious.     Objective:  BP 132/88 (BP Location: Left Arm)   Pulse 91   Temp 98 F (36.7 C) (Oral)   Ht 6' 1.5" (1.867 m)   Wt 273 lb 3.2 oz (123.9 kg)   SpO2 98%   BMI 35.56 kg/m   BP Readings from Last 3 Encounters:  06/01/20 132/88  05/13/19 130/84  05/07/18 130/82    Wt Readings from Last 3 Encounters:  06/01/20 273 lb 3.2 oz (123.9 kg)  05/13/19 271 lb (122.9 kg)  05/07/18 272 lb (123.4 kg)     Physical Exam Constitutional:      General: He is not in acute distress.    Appearance: He is well-developed. He is obese.     Comments: NAD  Eyes:     Conjunctiva/sclera: Conjunctivae normal.     Pupils: Pupils are equal, round, and reactive to light.  Neck:     Thyroid: No thyromegaly.     Vascular: No JVD.  Cardiovascular:     Rate and Rhythm: Normal rate and regular rhythm.     Heart sounds: Normal heart sounds. No murmur heard. No friction rub. No gallop.   Pulmonary:     Effort: Pulmonary effort is normal. No respiratory distress.     Breath sounds: Normal breath sounds. No wheezing or rales.  Chest:     Chest wall: No tenderness.  Abdominal:     General: Bowel sounds are normal. There is no distension.     Palpations: Abdomen is soft. There is no mass.     Tenderness: There is no abdominal tenderness. There is no guarding or rebound.  Musculoskeletal:        General: No tenderness. Normal range of motion.     Cervical back: Normal range of motion.  Lymphadenopathy:     Cervical: No cervical adenopathy.  Skin:    General: Skin is warm and dry.     Findings: No rash.  Neurological:     Mental Status: He is alert and oriented to person, place, and time.     Cranial Nerves: No cranial nerve  deficit.     Motor: No abnormal muscle tone.     Coordination: Coordination normal.     Gait: Gait normal.     Deep Tendon Reflexes: Reflexes are normal and symmetric.  Psychiatric:        Behavior: Behavior normal.        Thought Content: Thought content normal.        Judgment: Judgment normal.     Lab Results  Component Value Date   WBC 8.6 05/13/2019   HGB 15.6 05/13/2019   HCT 46.9 05/13/2019   PLT 246.0 05/13/2019   GLUCOSE 124 (H) 05/13/2019   CHOL 174 05/13/2019   TRIG 165.0 (H) 05/13/2019   HDL 37.50 (L) 05/13/2019   LDLDIRECT 113.0 05/01/2017   LDLCALC 103 (H) 05/13/2019   ALT 33 05/13/2019   AST 17 05/13/2019   NA 140 05/13/2019   K 4.3 05/13/2019    CL 105 05/13/2019   CREATININE 1.13 05/13/2019   BUN 16 05/13/2019   CO2 28 05/13/2019   TSH 0.78 05/13/2019   PSA 0.51 05/13/2019    No results found.  Assessment & Plan:   Follow-up: No follow-ups on file.  Sonda Primes, MD

## 2020-06-02 LAB — HIV ANTIBODY (ROUTINE TESTING W REFLEX): HIV 1&2 Ab, 4th Generation: NONREACTIVE

## 2020-06-03 ENCOUNTER — Other Ambulatory Visit (INDEPENDENT_AMBULATORY_CARE_PROVIDER_SITE_OTHER): Payer: 59

## 2020-06-03 DIAGNOSIS — R739 Hyperglycemia, unspecified: Secondary | ICD-10-CM | POA: Diagnosis not present

## 2020-06-03 LAB — HEMOGLOBIN A1C: Hgb A1c MFr Bld: 7.7 % — ABNORMAL HIGH (ref 4.6–6.5)

## 2020-06-08 ENCOUNTER — Encounter: Payer: Self-pay | Admitting: Internal Medicine

## 2020-06-08 ENCOUNTER — Other Ambulatory Visit: Payer: Self-pay | Admitting: Internal Medicine

## 2020-06-08 DIAGNOSIS — E119 Type 2 diabetes mellitus without complications: Secondary | ICD-10-CM | POA: Insufficient documentation

## 2020-06-08 MED ORDER — METFORMIN HCL 500 MG PO TABS: 500.0000 mg | ORAL_TABLET | Freq: Every day | ORAL | 3 refills | Status: DC

## 2020-08-17 ENCOUNTER — Ambulatory Visit: Payer: 59

## 2020-08-17 ENCOUNTER — Other Ambulatory Visit: Payer: Self-pay

## 2020-11-28 NOTE — Progress Notes (Signed)
Cardiology Office Note:    Date:  11/30/2020   ID:  Levi Burns, DOB 1975/10/13, MRN [id number]  PCP:  Tresa Garter, MD  Cardiologist:  None   Referring MD: Tresa Garter, MD   Chief Complaint  Patient presents with   Advice Only    Elevated hemoglobin A1c Hyperlipidemia Tobacco use Obesity     History of Present Illness:    [name] is a 45 y.o. male with a hx of DM II, obesity, hyperlipidemia, and tobacco abuse. Here for cardiovascular risk assessment. Works for [employer].   Mr. Klosinski's mother and brother are patients.  Mr. Atkerson is asymptomatic.  He is overweight, smokes cigarettes less than half pack per day, has high LDL greater than 100, and has an elevated hemoglobin A1c from 6 months ago of 7.7.  He states that he sleeps well.  He drives trucks at night.  He does not rest as well during the daytime.  No major complaints about snoring.  His paternal grandfather had 2 heart attacks.  He is physically and active.  He is not on any particular diet.  History reviewed. No pertinent past medical history.  Past Surgical History:  Procedure Laterality Date   WISDOM TOOTH EXTRACTION  03/2019    Current Medications: Current Meds  Medication Sig   Cholecalciferol (VITAMIN D3) 2000 units capsule Take 1 capsule (2,000 Units total) by mouth daily.   Multiple Vitamin (MULTIVITAMIN) tablet Take 1 tablet by mouth daily.     Allergies:   Patient has no known allergies.   Social History   Socioeconomic History   Marital status: Single    Spouse name: Not on file   Number of children: Not on file   Years of education: Not on file   Highest education level: Not on file  Occupational History   Not on file  Tobacco Use   Smoking status: Every Day    Packs/day: 0.50    Years: 22.00    Pack years: 11.00    Types: Cigarettes   Smokeless tobacco: Never  Substance and Sexual Activity   Alcohol use: Yes    Alcohol/week: 0.0 standard drinks   Drug  use: No   Sexual activity: Not on file  Other Topics Concern   Not on file  Social History Narrative   Not on file   Social Determinants of Health   Financial Resource Strain: Not on file  Food Insecurity: Not on file  Transportation Needs: Not on file  Physical Activity: Not on file  Stress: Not on file  Social Connections: Not on file     Family History: The patient's family history includes Hypertension in his father and mother.  ROS:   Please see the history of present illness.    Some difficulty sleeping.  Anxiety about keeping his job.  Anxiety about findings that could impair his ability to remain employed.  All other systems reviewed and are negative.  EKGs/Labs/Other Studies Reviewed:    The following studies were reviewed today: No cardiac data or cardiac imaging  EKG:  EKG normal without evidence of infarction  Recent Labs: 06/01/2020: ALT 33; BUN 15; Creatinine, Ser 1.07; Hemoglobin 15.1; Platelets 251.0; Potassium 4.5; Sodium 137; TSH 1.44  Recent Lipid Panel    Component Value Date/Time   CHOL 175 06/01/2020 1106   TRIG 128.0 06/01/2020 1106   HDL 35.50 (L) 06/01/2020 1106   CHOLHDL 5 06/01/2020 1106   VLDL 25.6 06/01/2020 1106   LDLCALC  114 (H) 06/01/2020 1106   LDLDIRECT 113.0 05/01/2017 0923    Physical Exam:    VS:  BP 130/84   Pulse 81   Ht 6' 1.5" (1.867 m)   Wt 278 lb (126.1 kg)   SpO2 97%   BMI 36.18 kg/m     Wt Readings from Last 3 Encounters:  11/30/20 278 lb (126.1 kg)  06/01/20 273 lb 3.2 oz (123.9 kg)  05/13/19 271 lb (122.9 kg)     GEN: Morbid. No acute distress HEENT: Normal NECK: No JVD. LYMPHATICS: No lymphadenopathy CARDIAC: No murmur. RRR no gallop, or edema. VASCULAR:  Normal Pulses. No bruits. RESPIRATORY:  Clear to auscultation without rales, wheezing or rhonchi  ABDOMEN: Soft, non-tender, non-distended, No pulsatile mass, MUSCULOSKELETAL: No deformity  SKIN: Warm and dry NEUROLOGIC:  Alert and oriented x  3 PSYCHIATRIC:  Normal affect   ASSESSMENT:    1. Type 2 diabetes mellitus without complication, without long-term current use of insulin (HCC)   2. Hyperlipidemia LDL goal <70   3. Tobacco abuse   4. Morbid obesity (HCC)   5. Elevated blood pressure reading without diagnosis of hypertension    PLAN:    In order of problems listed above:  Hemoglobin A1c 6 months ago greater than 7.  Metformin was prescribed based upon his electronic chart.  Medication never started.  Recommend low carbohydrate diet, follow-up with primary care physician for management, will need a nutritional/dietary consult, 150 minutes of moderate aerobic activity, and likely needs to be started on medications, either metformin or SGLT2 therapy. LDL is elevated although not severely at 114.  Diabetes target is LDL less than 70.  I have recommended he undergo coronary calcium scoring.  If his score is 0 we will allow time to work on risk factors before forcing statin therapy.  If score is elevated, he will need to be managed LDL less than 70. Stop smoking is recommended.  He is not able to give a quit date at this time. Needs to work on obesity. Need to consider sleep study. Blood pressure needs to be monitored closely.  Overall education and awareness concerning primary risk prevention was discussed in detail: LDL less than 70, hemoglobin A1c less than 7, blood pressure target less than 130/80 mmHg, >150 minutes of moderate aerobic activity per week, avoidance of smoking, weight control (via diet and exercise), and continued surveillance/management of/for obstructive sleep apnea.    Medication Adjustments/Labs and Tests Ordered: Current medicines are reviewed at length with the patient today.  Concerns regarding medicines are outlined above.  Orders Placed This Encounter  Procedures   CT CARDIAC SCORING (SELF PAY ONLY)   EKG 12-Lead   No orders of the defined types were placed in this encounter.   Patient  Instructions  Medication Instructions:  Your physician recommends that you continue on your current medications as directed. Please refer to the Current Medication list given to you today.  *If you need a refill on your cardiac medications before your next appointment, please call your pharmacy*   Lab Work: None If you have labs (blood work) drawn today and your tests are completely normal, you will receive your results only by: MyChart Message (if you have MyChart) OR A paper copy in the mail If you have any lab test that is abnormal or we need to change your treatment, we will call you to review the results.   Testing/Procedures: Your physician recommends that you have a Calcium Score performed.   Follow-Up:  At [facility], you and your health needs are our priority.  As part of our continuing mission to provide you with exceptional heart care, we have created designated Provider Care Teams.  These Care Teams include your primary Cardiologist (physician) and Advanced Practice Providers (APPs -  Physician Assistants and Nurse Practitioners) who all work together to provide you with the care you need, when you need it.  We recommend signing up for the patient portal called "MyChart".  Sign up information is provided on this After Visit Summary.  MyChart is used to connect with patients for Virtual Visits (Telemedicine).  Patients are able to view lab/test results, encounter notes, upcoming appointments, etc.  Non-urgent messages can be sent to your provider as well.   To learn more about what you can do with MyChart, go to [email or web address].    Your next appointment:   As needed  The format for your next appointment:   In Person  Provider:   You may see Dr. Verdis Prime or one of the following Advanced Practice Providers on your designated Care Team:   Nada Boozer, NP   Other Instructions  Your provider recommends that you maintain 150 minutes per week of moderate  aerobic activity.     Signed, Lesleigh Noe, MD  11/30/2020 10:17 AM    [facility]

## 2020-11-30 ENCOUNTER — Encounter: Payer: Self-pay | Admitting: Interventional Cardiology

## 2020-11-30 ENCOUNTER — Other Ambulatory Visit: Payer: Self-pay

## 2020-11-30 ENCOUNTER — Ambulatory Visit: Payer: 59 | Admitting: Interventional Cardiology

## 2020-11-30 VITALS — BP 130/84 | HR 81 | Ht 73.5 in | Wt 278.0 lb

## 2020-11-30 DIAGNOSIS — E785 Hyperlipidemia, unspecified: Secondary | ICD-10-CM

## 2020-11-30 DIAGNOSIS — E119 Type 2 diabetes mellitus without complications: Secondary | ICD-10-CM | POA: Diagnosis not present

## 2020-11-30 DIAGNOSIS — Z720 Tobacco use: Secondary | ICD-10-CM | POA: Diagnosis not present

## 2020-11-30 DIAGNOSIS — R030 Elevated blood-pressure reading, without diagnosis of hypertension: Secondary | ICD-10-CM

## 2020-11-30 NOTE — Patient Instructions (Signed)
Medication Instructions:  Your physician recommends that you continue on your current medications as directed. Please refer to the Current Medication list given to you today.  *If you need a refill on your cardiac medications before your next appointment, please call your pharmacy*   Lab Work: None If you have labs (blood work) drawn today and your tests are completely normal, you will receive your results only by: MyChart Message (if you have MyChart) OR A paper copy in the mail If you have any lab test that is abnormal or we need to change your treatment, we will call you to review the results.   Testing/Procedures: Your physician recommends that you have a Calcium Score performed.   Follow-Up: At [facility], you and your health needs are our priority.  As part of our continuing mission to provide you with exceptional heart care, we have created designated Provider Care Teams.  These Care Teams include your primary Cardiologist (physician) and Advanced Practice Providers (APPs -  Physician Assistants and Nurse Practitioners) who all work together to provide you with the care you need, when you need it.  We recommend signing up for the patient portal called "MyChart".  Sign up information is provided on this After Visit Summary.  MyChart is used to connect with patients for Virtual Visits (Telemedicine).  Patients are able to view lab/test results, encounter notes, upcoming appointments, etc.  Non-urgent messages can be sent to your provider as well.   To learn more about what you can do with MyChart, go to [email or web address].    Your next appointment:   As needed  The format for your next appointment:   In Person  Provider:   You may see Dr. Verdis Prime or one of the following Advanced Practice Providers on your designated Care Team:   Nada Boozer, NP   Other Instructions  Your provider recommends that you maintain 150 minutes per week of moderate aerobic activity.

## 2020-12-14 ENCOUNTER — Ambulatory Visit (INDEPENDENT_AMBULATORY_CARE_PROVIDER_SITE_OTHER)
Admission: RE | Admit: 2020-12-14 | Discharge: 2020-12-14 | Disposition: A | Discharge status: Home / Self Care | Payer: Self-pay | Source: Ambulatory Visit | Attending: Interventional Cardiology | Admitting: Interventional Cardiology

## 2020-12-14 ENCOUNTER — Other Ambulatory Visit: Payer: Self-pay

## 2020-12-14 DIAGNOSIS — E785 Hyperlipidemia, unspecified: Secondary | ICD-10-CM

## 2020-12-14 DIAGNOSIS — E119 Type 2 diabetes mellitus without complications: Secondary | ICD-10-CM

## 2021-02-22 ENCOUNTER — Other Ambulatory Visit: Payer: Self-pay

## 2021-02-22 ENCOUNTER — Ambulatory Visit: Payer: 59 | Attending: Internal Medicine

## 2021-02-22 ENCOUNTER — Other Ambulatory Visit (HOSPITAL_BASED_OUTPATIENT_CLINIC_OR_DEPARTMENT_OTHER): Payer: Self-pay

## 2021-02-22 DIAGNOSIS — Z23 Encounter for immunization: Secondary | ICD-10-CM

## 2021-02-22 MED ORDER — PFIZER COVID-19 VAC BIVALENT 30 MCG/0.3ML IM SUSP
INTRAMUSCULAR | 0 refills | Status: DC
  Filled 2021-02-22: qty 0.3, 1d supply, fill #0

## 2021-02-22 NOTE — Progress Notes (Signed)
° °  Covid-19 Vaccination Clinic  Name:  Levi Burns    MRN: [id number] DOB: Mar 15, 1975  02/22/2021  Mr. [name] was observed post Covid-19 immunization for 15 minutes without incident. He was provided with Vaccine Information Sheet and instruction to access the V-Safe system.   Mr. [name] was instructed to call 911 with any severe reactions post vaccine: Difficulty breathing  Swelling of face and throat  A fast heartbeat  A bad rash all over body  Dizziness and weakness   Immunizations Administered     Name Date Dose VIS Date Route   Pfizer Covid-19 Vaccine Bivalent Booster 02/22/2021 10:01 AM 0.3 mL 11/04/2020 Intramuscular   Manufacturer: [employer], [employer]   Lot: [id number]   NDC: [phone]

## 2021-06-07 ENCOUNTER — Encounter: Payer: 59 | Admitting: Internal Medicine

## 2021-06-21 ENCOUNTER — Ambulatory Visit (INDEPENDENT_AMBULATORY_CARE_PROVIDER_SITE_OTHER): Payer: 59 | Admitting: Internal Medicine

## 2021-06-21 ENCOUNTER — Encounter: Payer: Self-pay | Admitting: Internal Medicine

## 2021-06-21 VITALS — BP 140/92 | HR 86 | Temp 98.7°F | Ht 73.5 in | Wt 280.0 lb

## 2021-06-21 DIAGNOSIS — Z6836 Body mass index (BMI) 36.0-36.9, adult: Secondary | ICD-10-CM

## 2021-06-21 DIAGNOSIS — E119 Type 2 diabetes mellitus without complications: Secondary | ICD-10-CM | POA: Diagnosis not present

## 2021-06-21 DIAGNOSIS — M2550 Pain in unspecified joint: Secondary | ICD-10-CM

## 2021-06-21 DIAGNOSIS — F17200 Nicotine dependence, unspecified, uncomplicated: Secondary | ICD-10-CM | POA: Diagnosis not present

## 2021-06-21 DIAGNOSIS — Z0000 Encounter for general adult medical examination without abnormal findings: Secondary | ICD-10-CM

## 2021-06-21 DIAGNOSIS — E669 Obesity, unspecified: Secondary | ICD-10-CM | POA: Insufficient documentation

## 2021-06-21 LAB — POCT GLYCOSYLATED HEMOGLOBIN (HGB A1C): Hemoglobin A1C: 7.4 % — AB (ref 4.0–5.6)

## 2021-06-21 MED ORDER — RYBELSUS 3 MG PO TABS: 3.0000 mg | ORAL_TABLET | Freq: Every day | ORAL | 3 refills | Status: DC

## 2021-06-21 MED ORDER — METFORMIN HCL 500 MG PO TABS: 500.0000 mg | ORAL_TABLET | Freq: Every day | ORAL | 3 refills | Status: DC

## 2021-06-21 NOTE — Assessment & Plan Note (Signed)
BMI 36 ?Added Rybelsus ?

## 2021-06-21 NOTE — Patient Instructions (Signed)
Blue-Emu cream -- use 2-3 times a day ? ?

## 2021-06-21 NOTE — Assessment & Plan Note (Addendum)
Re-start Metformin 500 mg once a day ?Rybelsus Rx given ?

## 2021-06-21 NOTE — Progress Notes (Signed)
? ?Subjective:  ?Patient ID: Levi Burns, male    DOB: 09/17/1975  Age: 46 y.o. MRN: [id number] ? ?CC: No chief complaint on file. ? ? ?HPI ?[name] presents for a well exam ?F/u DM, obesity ? ?Outpatient Medications Prior to Visit  ?Medication Sig Dispense Refill  ? Cholecalciferol (VITAMIN D3) 2000 units capsule Take 1 capsule (2,000 Units total) by mouth daily. 100 capsule 3  ? COVID-19 mRNA bivalent vaccine, Pfizer, (PFIZER COVID-19 VAC BIVALENT) injection Inject into the muscle. 0.3 mL 0  ? Multiple Vitamin (MULTIVITAMIN) tablet Take 1 tablet by mouth daily.    ? metFORMIN (GLUCOPHAGE) 500 MG tablet Take 1 tablet (500 mg total) by mouth daily with breakfast. (Patient not taking: Reported on 11/30/2020) 90 tablet 3  ? ?No facility-administered medications prior to visit.  ? ? ?ROS: ?Review of Systems  ?Constitutional:  Negative for appetite change, fatigue and unexpected weight change.  ?HENT:  Negative for congestion, nosebleeds, sneezing, sore throat and trouble swallowing.   ?Eyes:  Negative for itching and visual disturbance.  ?Respiratory:  Negative for cough.   ?Cardiovascular:  Negative for chest pain, palpitations and leg swelling.  ?Gastrointestinal:  Negative for abdominal distention, blood in stool, diarrhea and nausea.  ?Genitourinary:  Negative for frequency and hematuria.  ?Musculoskeletal:  Negative for back pain, gait problem, joint swelling and neck pain.  ?Skin:  Negative for rash.  ?Neurological:  Negative for dizziness, tremors, speech difficulty and weakness.  ?Psychiatric/Behavioral:  Negative for agitation, dysphoric mood and sleep disturbance. The patient is not nervous/anxious.   ? ?Objective:  ?BP (!) 140/92 (BP Location: Left Arm, Patient Position: Sitting, Cuff Size: Large)   Pulse 86   Temp 98.7 ?F (37.1 ?C) (Oral)   Ht 6' 1.5" (1.867 m)   Wt 280 lb (127 kg)   SpO2 99%   BMI 36.44 kg/m?  ? ?BP Readings from Last 3 Encounters:  ?06/21/21 (!) 140/92  ?11/30/20 130/84  ?06/01/20  132/88  ? ? ?Wt Readings from Last 3 Encounters:  ?06/21/21 280 lb (127 kg)  ?11/30/20 278 lb (126.1 kg)  ?06/01/20 273 lb 3.2 oz (123.9 kg)  ? ? ?Physical Exam ?Constitutional:   ?   General: He is not in acute distress. ?   Appearance: He is well-developed.  ?   Comments: NAD  ?Eyes:  ?   Conjunctiva/sclera: Conjunctivae normal.  ?   Pupils: Pupils are equal, round, and reactive to light.  ?Neck:  ?   Thyroid: No thyromegaly.  ?   Vascular: No JVD.  ?Cardiovascular:  ?   Rate and Rhythm: Normal rate and regular rhythm.  ?   Heart sounds: Normal heart sounds. No murmur heard. ?  No friction rub. No gallop.  ?Pulmonary:  ?   Effort: Pulmonary effort is normal. No respiratory distress.  ?   Breath sounds: Normal breath sounds. No wheezing or rales.  ?Chest:  ?   Chest wall: No tenderness.  ?Abdominal:  ?   General: Bowel sounds are normal. There is no distension.  ?   Palpations: Abdomen is soft. There is no mass.  ?   Tenderness: There is no abdominal tenderness. There is no guarding or rebound.  ?Musculoskeletal:     ?   General: No tenderness. Normal range of motion.  ?   Cervical back: Normal range of motion.  ?Lymphadenopathy:  ?   Cervical: No cervical adenopathy.  ?Skin: ?   General: Skin is warm and dry.  ?  Findings: No rash.  ?Neurological:  ?   Mental Status: He is alert and oriented to person, place, and time.  ?   Cranial Nerves: No cranial nerve deficit.  ?   Motor: No abnormal muscle tone.  ?   Coordination: Coordination normal.  ?   Gait: Gait normal.  ?   Deep Tendon Reflexes: Reflexes are normal and symmetric.  ?Psychiatric:     ?   Behavior: Behavior normal.     ?   Thought Content: Thought content normal.     ?   Judgment: Judgment normal.  ? ? ?Lab Results  ?Component Value Date  ? WBC 8.2 06/01/2020  ? HGB 15.1 06/01/2020  ? HCT 44.6 06/01/2020  ? PLT 251.0 06/01/2020  ? GLUCOSE 131 (H) 06/01/2020  ? CHOL 175 06/01/2020  ? TRIG 128.0 06/01/2020  ? HDL 35.50 (L) 06/01/2020  ? LDLDIRECT 113.0  05/01/2017  ? LDLCALC 114 (H) 06/01/2020  ? ALT 33 06/01/2020  ? AST 21 06/01/2020  ? NA 137 06/01/2020  ? K 4.5 06/01/2020  ? CL 103 06/01/2020  ? CREATININE 1.07 06/01/2020  ? BUN 15 06/01/2020  ? CO2 26 06/01/2020  ? TSH 1.44 06/01/2020  ? PSA 0.72 06/01/2020  ? HGBA1C 7.4 (A) 06/21/2021  ? ? ?CT CARDIAC SCORING (SELF PAY ONLY) ? ?Addendum Date: 12/19/2020   ?ADDENDUM REPORT: 12/19/2020 12:26 CLINICAL DATA:  Risk stratification EXAM: Coronary Calcium Score TECHNIQUE: The patient was scanned on a Siemens Somatom 64 slice scanner. Axial non-contrast 3 mm slices were carried out through the heart. The data set was analyzed on a dedicated work station and scored using the [place]. FINDINGS: Non-cardiac: See separate report from [facility]. Ascending aorta: Mild dilatation 3.9 cm Pericardium: Normal Coronary arteries: No calcium noted IMPRESSION: Coronary calcium score of 0. Jenkins Rouge Electronically Signed   By: Jenkins Rouge M.D.   On: 12/19/2020 12:26  ? ?Result Date: 12/19/2020 ?EXAM: OVER-READ INTERPRETATION  CT CHEST The following report is an over-read performed by radiologist Dr. Vinnie Langton of [facility], [place] on 12/14/2020. This over-read does not include interpretation of cardiac or coronary anatomy or pathology. The coronary calcium score interpretation by the cardiologist is attached. COMPARISON:  No priors. FINDINGS: Within the visualized portions of the thorax there are no suspicious appearing pulmonary nodules or masses, there is no acute consolidative airspace disease, no pleural effusions, no pneumothorax and no lymphadenopathy. Visualized portions of the upper abdomen demonstrates diffuse low attenuation throughout the visualized hepatic parenchyma, indicative of hepatic steatosis. There are no aggressive appearing lytic or blastic lesions noted in the visualized portions of the skeleton. IMPRESSION: 1. Hepatic steatosis. Electronically Signed: By: Vinnie Langton  M.D. On: 12/14/2020 10:40  ? ? ?Assessment & Plan:  ? ?Problem List Items Addressed This Visit   ? ? Well adult exam - Primary  ?   ?We discussed age appropriate health related issues, including available/recomended screening tests and vaccinations. Labs were ordered to be later reviewed . All questions were answered. We discussed one or more of the following - seat belt use, use of sunscreen/sun exposure exercise, fall risk reduction, second hand smoke exposure, firearm use and storage, seat belt use, a need for adhering to healthy diet and exercise. ?Labs were ordered.  All questions were answered. ? ? ?  ?  ? Relevant Orders  ? TSH  ? Urinalysis  ? CBC with Differential/Platelet  ? Lipid panel  ? PSA  ? Comprehensive metabolic panel  ?  Tobacco use disorder  ?  Discussed ?Try ON! ? ?  ?  ? Type 2 diabetes mellitus without complications ([place])  ?  Re-start Metformin 500 mg once a day ?Rybelsus Rx given ?  ?  ? Relevant Medications  ? metFORMIN (GLUCOPHAGE) 500 MG tablet  ? Semaglutide (RYBELSUS) 3 MG TABS  ? Other Relevant Orders  ? POCT glycosylated hemoglobin (Hb A1C) (Completed)  ? Hemoglobin A1c  ? Arthralgia  ?  Blue-Emu cream  -- use 2-3 times a day ?  ?  ? Obese  ?  BMI 36 ?Added Rybelsus ? ?  ?  ? Relevant Medications  ? metFORMIN (GLUCOPHAGE) 500 MG tablet  ? Semaglutide (RYBELSUS) 3 MG TABS  ?  ? ? ?Meds ordered this encounter  ?Medications  ? metFORMIN (GLUCOPHAGE) 500 MG tablet  ?  Sig: Take 1 tablet (500 mg total) by mouth daily with breakfast.  ?  Dispense:  90 tablet  ?  Refill:  3  ? Semaglutide (RYBELSUS) 3 MG TABS  ?  Sig: Take 3 mg by mouth daily.  ?  Dispense:  30 tablet  ?  Refill:  3  ?  ? ? ?Follow-up: Return in about 3 months (around 09/20/2021) for a follow-up visit. ? ?Walker Kehr, MD ?

## 2021-06-21 NOTE — Assessment & Plan Note (Signed)
Discussed ?Try ON! ?

## 2021-06-21 NOTE — Assessment & Plan Note (Signed)

## 2021-06-21 NOTE — Assessment & Plan Note (Signed)
Blue-Emu cream -- use 2-3 times a day ? ?

## 2021-09-20 ENCOUNTER — Encounter: Payer: Self-pay | Admitting: Internal Medicine

## 2021-09-20 ENCOUNTER — Ambulatory Visit: Payer: 59 | Admitting: Internal Medicine

## 2021-09-20 DIAGNOSIS — R030 Elevated blood-pressure reading, without diagnosis of hypertension: Secondary | ICD-10-CM | POA: Insufficient documentation

## 2021-09-20 DIAGNOSIS — Z0000 Encounter for general adult medical examination without abnormal findings: Secondary | ICD-10-CM | POA: Diagnosis not present

## 2021-09-20 DIAGNOSIS — E119 Type 2 diabetes mellitus without complications: Secondary | ICD-10-CM | POA: Diagnosis not present

## 2021-09-20 DIAGNOSIS — Z6836 Body mass index (BMI) 36.0-36.9, adult: Secondary | ICD-10-CM

## 2021-09-20 LAB — URINALYSIS
Bilirubin Urine: NEGATIVE
Hgb urine dipstick: NEGATIVE
Ketones, ur: NEGATIVE
Leukocytes,Ua: NEGATIVE
Nitrite: NEGATIVE
Specific Gravity, Urine: 1.025 (ref 1.000–1.030)
Urine Glucose: NEGATIVE
Urobilinogen, UA: 0.2 (ref 0.0–1.0)
pH: 6 (ref 5.0–8.0)

## 2021-09-20 LAB — LIPID PANEL
Cholesterol: 164 mg/dL (ref 0–200)
HDL: 35.8 mg/dL — ABNORMAL LOW (ref >39.00)
LDL Cholesterol: 109 mg/dL — ABNORMAL HIGH (ref 0–99)
NonHDL: 127.96
Total CHOL/HDL Ratio: 5
Triglycerides: 96 mg/dL (ref 0.0–149.0)
VLDL: 19.2 mg/dL (ref 0.0–40.0)

## 2021-09-20 LAB — CBC WITH DIFFERENTIAL/PLATELET
Basophils Absolute: 0.1 10*3/uL (ref 0.0–0.1)
Basophils Relative: 0.9 % (ref 0.0–3.0)
Eosinophils Absolute: 0.3 10*3/uL (ref 0.0–0.7)
Eosinophils Relative: 4.2 % (ref 0.0–5.0)
HCT: 45.3 % (ref 39.0–52.0)
Hemoglobin: 15.2 g/dL (ref 13.0–17.0)
Lymphocytes Relative: 28 % (ref 12.0–46.0)
Lymphs Abs: 2.2 10*3/uL (ref 0.7–4.0)
MCHC: 33.5 g/dL (ref 30.0–36.0)
MCV: 77.1 fl — ABNORMAL LOW (ref 78.0–100.0)
Monocytes Absolute: 0.6 10*3/uL (ref 0.1–1.0)
Monocytes Relative: 7.2 % (ref 3.0–12.0)
Neutro Abs: 4.8 10*3/uL (ref 1.4–7.7)
Neutrophils Relative %: 59.7 % (ref 43.0–77.0)
Platelets: 252 10*3/uL (ref 150.0–400.0)
RBC: 5.87 Mil/uL — ABNORMAL HIGH (ref 4.22–5.81)
RDW: 13.7 % (ref 11.5–15.5)
WBC: 8 10*3/uL (ref 4.0–10.5)

## 2021-09-20 LAB — COMPREHENSIVE METABOLIC PANEL
ALT: 30 U/L (ref 0–53)
AST: 22 U/L (ref 0–37)
Albumin: 4.9 g/dL (ref 3.5–5.2)
Alkaline Phosphatase: 57 U/L (ref 39–117)
BUN: 20 mg/dL (ref 6–23)
CO2: 23 mEq/L (ref 19–32)
Calcium: 9.6 mg/dL (ref 8.4–10.5)
Chloride: 106 mEq/L (ref 96–112)
Creatinine, Ser: 1.04 mg/dL (ref 0.40–1.50)
GFR: 85.52 mL/min (ref >60.00)
Glucose, Bld: 104 mg/dL — ABNORMAL HIGH (ref 70–99)
Potassium: 4.4 mEq/L (ref 3.5–5.1)
Sodium: 141 mEq/L (ref 135–145)
Total Bilirubin: 0.4 mg/dL (ref 0.2–1.2)
Total Protein: 7.2 g/dL (ref 6.0–8.3)

## 2021-09-20 LAB — TSH: TSH: 1.12 u[IU]/mL (ref 0.35–5.50)

## 2021-09-20 LAB — HEMOGLOBIN A1C: Hgb A1c MFr Bld: 6.7 % — ABNORMAL HIGH (ref 4.6–6.5)

## 2021-09-20 LAB — PSA: PSA: 0.53 ng/mL (ref 0.10–4.00)

## 2021-09-20 MED ORDER — RYBELSUS 7 MG PO TABS: 7.0000 mg | ORAL_TABLET | Freq: Every day | ORAL | 3 refills | Status: DC

## 2021-09-20 NOTE — Assessment & Plan Note (Addendum)
Pt stopped Metformin Pt lost wt on Rybelsus, diet. Will increase to 5 mg/d

## 2021-09-20 NOTE — Progress Notes (Signed)
Subjective:  Patient ID: Levi Burns, male    DOB: Feb 03, 1976  Age: 46 y.o. MRN: [id number]  CC: No chief complaint on file.   HPI [name] presents for DM, elevated BP, obesity. Pt lost wt on Rybelsus, diet  Outpatient Medications Prior to Visit  Medication Sig Dispense Refill   Cholecalciferol (VITAMIN D3) 2000 units capsule Take 1 capsule (2,000 Units total) by mouth daily. 100 capsule 3   COVID-19 mRNA bivalent vaccine, Pfizer, (PFIZER COVID-19 VAC BIVALENT) injection Inject into the muscle. 0.3 mL 0   Multiple Vitamin (MULTIVITAMIN) tablet Take 1 tablet by mouth daily.     Semaglutide (RYBELSUS) 3 MG TABS Take 3 mg by mouth daily. 30 tablet 3   metFORMIN (GLUCOPHAGE) 500 MG tablet Take 1 tablet (500 mg total) by mouth daily with breakfast. (Patient not taking: Reported on 09/20/2021) 90 tablet 3   No facility-administered medications prior to visit.    ROS: Review of Systems  Constitutional:  Negative for appetite change, fatigue and unexpected weight change.  HENT:  Negative for congestion, nosebleeds, sneezing, sore throat and trouble swallowing.   Eyes:  Negative for itching and visual disturbance.  Respiratory:  Negative for cough.   Cardiovascular:  Negative for chest pain, palpitations and leg swelling.  Gastrointestinal:  Negative for abdominal distention, blood in stool, diarrhea and nausea.  Genitourinary:  Negative for frequency and hematuria.  Musculoskeletal:  Negative for back pain, gait problem, joint swelling and neck pain.  Skin:  Negative for rash.  Neurological:  Negative for dizziness, tremors, speech difficulty and weakness.  Psychiatric/Behavioral:  Negative for agitation, dysphoric mood and sleep disturbance. The patient is not nervous/anxious.     Objective:  BP 120/80 (BP Location: Left Arm, Patient Position: Sitting, Cuff Size: Large)   Pulse 73   Temp 98.5 F (36.9 C) (Oral)   Ht 6' 1.5" (1.867 m)   Wt 260 lb (117.9 kg)   SpO2 98%   BMI  33.84 kg/m   BP Readings from Last 3 Encounters:  09/20/21 120/80  06/21/21 (!) 140/92  11/30/20 130/84    Wt Readings from Last 3 Encounters:  09/20/21 260 lb (117.9 kg)  06/21/21 280 lb (127 kg)  11/30/20 278 lb (126.1 kg)    Physical Exam Constitutional:      General: He is not in acute distress.    Appearance: He is well-developed.     Comments: NAD  Eyes:     Conjunctiva/sclera: Conjunctivae normal.     Pupils: Pupils are equal, round, and reactive to light.  Neck:     Thyroid: No thyromegaly.     Vascular: No JVD.  Cardiovascular:     Rate and Rhythm: Normal rate and regular rhythm.     Heart sounds: Normal heart sounds. No murmur heard.    No friction rub. No gallop.  Pulmonary:     Effort: Pulmonary effort is normal. No respiratory distress.     Breath sounds: Normal breath sounds. No wheezing or rales.  Chest:     Chest wall: No tenderness.  Abdominal:     General: Bowel sounds are normal. There is no distension.     Palpations: Abdomen is soft. There is no mass.     Tenderness: There is no abdominal tenderness. There is no guarding or rebound.  Musculoskeletal:        General: No tenderness. Normal range of motion.     Cervical back: Normal range of motion.  Lymphadenopathy:  Cervical: No cervical adenopathy.  Skin:    General: Skin is warm and dry.     Findings: No rash.  Neurological:     Mental Status: He is alert and oriented to person, place, and time.     Cranial Nerves: No cranial nerve deficit.     Motor: No abnormal muscle tone.     Coordination: Coordination normal.     Gait: Gait normal.     Deep Tendon Reflexes: Reflexes are normal and symmetric.  Psychiatric:        Behavior: Behavior normal.        Thought Content: Thought content normal.        Judgment: Judgment normal.     Lab Results  Component Value Date   WBC 8.2 06/01/2020   HGB 15.1 06/01/2020   HCT 44.6 06/01/2020   PLT 251.0 06/01/2020   GLUCOSE 131 (H) 06/01/2020    CHOL 175 06/01/2020   TRIG 128.0 06/01/2020   HDL 35.50 (L) 06/01/2020   LDLDIRECT 113.0 05/01/2017   LDLCALC 114 (H) 06/01/2020   ALT 33 06/01/2020   AST 21 06/01/2020   NA 137 06/01/2020   K 4.5 06/01/2020   CL 103 06/01/2020   CREATININE 1.07 06/01/2020   BUN 15 06/01/2020   CO2 26 06/01/2020   TSH 1.44 06/01/2020   PSA 0.72 06/01/2020   HGBA1C 7.4 (A) 06/21/2021    CT CARDIAC SCORING (SELF PAY ONLY)  Addendum Date: 12/19/2020   ADDENDUM REPORT: 12/19/2020 12:26 CLINICAL DATA:  Risk stratification EXAM: Coronary Calcium Score TECHNIQUE: The patient was scanned on a Siemens Somatom 64 slice scanner. Axial non-contrast 3 mm slices were carried out through the heart. The data set was analyzed on a dedicated work station and scored using the Agatson method. FINDINGS: Non-cardiac: See separate report from [facility]. Ascending aorta: Mild dilatation 3.9 cm Pericardium: Normal Coronary arteries: No calcium noted IMPRESSION: Coronary calcium score of 0. [name] Electronically Signed   By: [name] M.D.   On: 12/19/2020 12:26   Result Date: 12/19/2020 EXAM: OVER-READ INTERPRETATION  CT CHEST The following report is an over-read performed by radiologist Dr. [name] of [name] on 12/14/2020. This over-read does not include interpretation of cardiac or coronary anatomy or pathology. The coronary calcium score interpretation by the cardiologist is attached. COMPARISON:  No priors. FINDINGS: Within the visualized portions of the thorax there are no suspicious appearing pulmonary nodules or masses, there is no acute consolidative airspace disease, no pleural effusions, no pneumothorax and no lymphadenopathy. Visualized portions of the upper abdomen demonstrates diffuse low attenuation throughout the visualized hepatic parenchyma, indicative of hepatic steatosis. There are no aggressive appearing lytic or blastic lesions noted in the visualized portions of  the skeleton. IMPRESSION: 1. Hepatic steatosis. Electronically Signed: By: [name] M.D. On: 12/14/2020 10:40    Assessment & Plan:   Problem List Items Addressed This Visit     Elevated BP without diagnosis of hypertension    Normalized BP w/wt loss      Obese    Pt lost wt on Rybelsus, diet Pt stopped Metformin      Relevant Medications   Semaglutide (RYBELSUS) 7 MG TABS   Type 2 diabetes mellitus without complications (HCC)    Pt stopped Metformin Pt lost wt on Rybelsus, diet. Will increase to 5 mg/d      Relevant Medications   Semaglutide (RYBELSUS) 7 MG TABS      Meds ordered this encounter  Medications   Semaglutide (RYBELSUS) 7 MG TABS    Sig: Take 7 mg by mouth daily.    Dispense:  30 tablet    Refill:  3      Follow-up: No follow-ups on file.  Sonda Primes, MD

## 2021-09-20 NOTE — Assessment & Plan Note (Signed)
Normalized BP w/wt loss

## 2021-09-20 NOTE — Assessment & Plan Note (Addendum)
Pt lost wt on Rybelsus, diet Pt stopped Metformin

## 2021-12-27 ENCOUNTER — Ambulatory Visit: Payer: 59 | Admitting: Internal Medicine

## 2021-12-27 ENCOUNTER — Encounter: Payer: Self-pay | Admitting: Internal Medicine

## 2021-12-27 DIAGNOSIS — Z6835 Body mass index (BMI) 35.0-35.9, adult: Secondary | ICD-10-CM

## 2021-12-27 DIAGNOSIS — R030 Elevated blood-pressure reading, without diagnosis of hypertension: Secondary | ICD-10-CM | POA: Diagnosis not present

## 2021-12-27 DIAGNOSIS — G4726 Circadian rhythm sleep disorder, shift work type: Secondary | ICD-10-CM

## 2021-12-27 DIAGNOSIS — E119 Type 2 diabetes mellitus without complications: Secondary | ICD-10-CM | POA: Diagnosis not present

## 2021-12-27 MED ORDER — RYBELSUS 7 MG PO TABS
7.0000 mg | ORAL_TABLET | Freq: Every day | ORAL | 3 refills | Status: DC
Start: 2021-12-27 — End: 2022-07-04

## 2021-12-27 NOTE — Progress Notes (Signed)
Subjective:  Patient ID: Levi Burns, male    DOB: 11/06/1975  Age: 46 y.o. MRN: [id number]  CC: Follow-up (3 month f/u)   HPI Levi Burns presents for DM, HTN, obesity  Outpatient Medications Prior to Visit  Medication Sig Dispense Refill   Cholecalciferol (VITAMIN D3) 2000 units capsule Take 1 capsule (2,000 Units total) by mouth daily. 100 capsule 3   Multiple Vitamin (MULTIVITAMIN) tablet Take 1 tablet by mouth daily.     Semaglutide (RYBELSUS) 7 MG TABS Take 7 mg by mouth daily. 30 tablet 3   COVID-19 mRNA bivalent vaccine, Pfizer, (PFIZER COVID-19 VAC BIVALENT) injection Inject into the muscle. (Patient not taking: Reported on 12/27/2021) 0.3 mL 0   No facility-administered medications prior to visit.    ROS: Review of Systems  Constitutional:  Negative for appetite change, fatigue and unexpected weight change.  HENT:  Negative for congestion, nosebleeds, sneezing, sore throat and trouble swallowing.   Eyes:  Negative for itching and visual disturbance.  Respiratory:  Negative for cough.   Cardiovascular:  Negative for chest pain, palpitations and leg swelling.  Gastrointestinal:  Negative for abdominal distention, blood in stool, diarrhea and nausea.  Genitourinary:  Negative for frequency and hematuria.  Musculoskeletal:  Negative for back pain, gait problem, joint swelling and neck pain.  Skin:  Negative for rash.  Neurological:  Negative for dizziness, tremors, speech difficulty and weakness.  Psychiatric/Behavioral:  Negative for agitation, dysphoric mood and sleep disturbance. The patient is not nervous/anxious.     Objective:  BP 122/70 (BP Location: Left Arm)   Pulse 75   Temp 98.7 F (37.1 C) (Oral)   Ht 6' (1.829 m)   Wt 244 lb (110.7 kg)   SpO2 97%   BMI 33.09 kg/m   BP Readings from Last 3 Encounters:  12/27/21 122/70  09/20/21 120/80  06/21/21 (!) 140/92    Wt Readings from Last 3 Encounters:  12/27/21 244 lb (110.7 kg)  09/20/21 260 lb (117.9  kg)  06/21/21 280 lb (127 kg)    Physical Exam Constitutional:      General: He is not in acute distress.    Appearance: He is well-developed.     Comments: NAD  Eyes:     Conjunctiva/sclera: Conjunctivae normal.     Pupils: Pupils are equal, round, and reactive to light.  Neck:     Thyroid: No thyromegaly.     Vascular: No JVD.  Cardiovascular:     Rate and Rhythm: Normal rate and regular rhythm.     Heart sounds: Normal heart sounds. No murmur heard.    No friction rub. No gallop.  Pulmonary:     Effort: Pulmonary effort is normal. No respiratory distress.     Breath sounds: Normal breath sounds. No wheezing or rales.  Chest:     Chest wall: No tenderness.  Abdominal:     General: Bowel sounds are normal. There is no distension.     Palpations: Abdomen is soft. There is no mass.     Tenderness: There is no abdominal tenderness. There is no guarding or rebound.  Musculoskeletal:        General: No tenderness. Normal range of motion.     Cervical back: Normal range of motion.  Lymphadenopathy:     Cervical: No cervical adenopathy.  Skin:    General: Skin is warm and dry.     Findings: No rash.  Neurological:     Mental Status: He is alert and oriented to  person, place, and time.     Cranial Nerves: No cranial nerve deficit.     Motor: No abnormal muscle tone.     Coordination: Coordination normal.     Gait: Gait normal.     Deep Tendon Reflexes: Reflexes are normal and symmetric.  Psychiatric:        Behavior: Behavior normal.        Thought Content: Thought content normal.        Judgment: Judgment normal.     Lab Results  Component Value Date   WBC 8.0 09/20/2021   HGB 15.2 09/20/2021   HCT 45.3 09/20/2021   PLT 252.0 09/20/2021   GLUCOSE 104 (H) 09/20/2021   CHOL 164 09/20/2021   TRIG 96.0 09/20/2021   HDL 35.80 (L) 09/20/2021   LDLDIRECT 113.0 05/01/2017   LDLCALC 109 (H) 09/20/2021   ALT 30 09/20/2021   AST 22 09/20/2021   NA 141 09/20/2021   K  4.4 09/20/2021   CL 106 09/20/2021   CREATININE 1.04 09/20/2021   BUN 20 09/20/2021   CO2 23 09/20/2021   TSH 1.12 09/20/2021   PSA 0.53 09/20/2021   HGBA1C 6.7 (H) 09/20/2021    CT CARDIAC SCORING (SELF PAY ONLY)  Addendum Date: 12/19/2020   ADDENDUM REPORT: 12/19/2020 12:26 CLINICAL DATA:  Risk stratification EXAM: Coronary Calcium Score TECHNIQUE: The patient was scanned on a Siemens Somatom 64 slice scanner. Axial non-contrast 3 mm slices were carried out through the heart. The data set was analyzed on a dedicated work station and scored using the [place]. FINDINGS: Non-cardiac: See separate report from [facility]. Ascending aorta: Mild dilatation 3.9 cm Pericardium: Normal Coronary arteries: No calcium noted IMPRESSION: Coronary calcium score of 0. Jenkins Rouge Electronically Signed   By: Jenkins Rouge M.D.   On: 12/19/2020 12:26   Result Date: 12/19/2020 EXAM: OVER-READ INTERPRETATION  CT CHEST The following report is an over-read performed by radiologist Dr. Vinnie Langton of [facility], [place] on 12/14/2020. This over-read does not include interpretation of cardiac or coronary anatomy or pathology. The coronary calcium score interpretation by the cardiologist is attached. COMPARISON:  No priors. FINDINGS: Within the visualized portions of the thorax there are no suspicious appearing pulmonary nodules or masses, there is no acute consolidative airspace disease, no pleural effusions, no pneumothorax and no lymphadenopathy. Visualized portions of the upper abdomen demonstrates diffuse low attenuation throughout the visualized hepatic parenchyma, indicative of hepatic steatosis. There are no aggressive appearing lytic or blastic lesions noted in the visualized portions of the skeleton. IMPRESSION: 1. Hepatic steatosis. Electronically Signed: By: Vinnie Langton M.D. On: 12/14/2020 10:40    Assessment & Plan:   Problem List Items Addressed This Visit     Elevated BP  without diagnosis of hypertension    Nl BP after wt loss      Obese    Better  Wt Readings from Last 3 Encounters:  12/27/21 244 lb (110.7 kg)  09/20/21 260 lb (117.9 kg)  06/21/21 280 lb (127 kg)         Relevant Medications   Semaglutide (RYBELSUS) 7 MG TABS   Shift work sleep disorder    Doing well      Type 2 diabetes mellitus without complications ([place])    Podiatry ref L foot w/pain - callus, crack on the 5th digit Triamc oint      Relevant Medications   Semaglutide (RYBELSUS) 7 MG TABS   Other Relevant Orders   Comprehensive metabolic panel  Hemoglobin A1c      Meds ordered this encounter  Medications   Semaglutide (RYBELSUS) 7 MG TABS    Sig: Take 7 mg by mouth daily.    Dispense:  30 tablet    Refill:  3      Follow-up: Return in about 3 months (around 03/29/2022) for a follow-up visit.  Sonda Primes, MD

## 2021-12-27 NOTE — Addendum Note (Signed)
Addended by: Susy Manor on: 24/46/2863 10:59 AM   Modules accepted: Orders

## 2021-12-27 NOTE — Assessment & Plan Note (Signed)
Nl BP after wt loss

## 2021-12-27 NOTE — Assessment & Plan Note (Addendum)
Better  Wt Readings from Last 3 Encounters:  12/27/21 244 lb (110.7 kg)  09/20/21 260 lb (117.9 kg)  06/21/21 280 lb (127 kg)

## 2021-12-27 NOTE — Assessment & Plan Note (Signed)
Doing well 

## 2021-12-27 NOTE — Assessment & Plan Note (Signed)
Podiatry ref L foot w/pain - callus, crack on the 5th digit Triamc oint

## 2021-12-29 ENCOUNTER — Encounter: Payer: Self-pay | Admitting: Internal Medicine

## 2022-04-04 ENCOUNTER — Ambulatory Visit: Payer: 59 | Admitting: Internal Medicine

## 2022-04-04 ENCOUNTER — Encounter: Payer: Self-pay | Admitting: Internal Medicine

## 2022-04-04 VITALS — BP 122/80 | HR 77 | Temp 98.9°F | Ht 72.0 in | Wt 246.0 lb

## 2022-04-04 DIAGNOSIS — E119 Type 2 diabetes mellitus without complications: Secondary | ICD-10-CM | POA: Diagnosis not present

## 2022-04-04 LAB — COMPREHENSIVE METABOLIC PANEL WITH GFR
ALT: 28 U/L (ref 0–53)
AST: 25 U/L (ref 0–37)
Albumin: 4.6 g/dL (ref 3.5–5.2)
Alkaline Phosphatase: 64 U/L (ref 39–117)
BUN: 18 mg/dL (ref 6–23)
CO2: 27 meq/L (ref 19–32)
Calcium: 9.6 mg/dL (ref 8.4–10.5)
Chloride: 103 meq/L (ref 96–112)
Creatinine, Ser: 1.12 mg/dL (ref 0.40–1.50)
GFR: 77.95 mL/min
Glucose, Bld: 108 mg/dL — ABNORMAL HIGH (ref 70–99)
Potassium: 4.4 meq/L (ref 3.5–5.1)
Sodium: 138 meq/L (ref 135–145)
Total Bilirubin: 0.4 mg/dL (ref 0.2–1.2)
Total Protein: 7.8 g/dL (ref 6.0–8.3)

## 2022-04-04 LAB — HEMOGLOBIN A1C: Hgb A1c MFr Bld: 6.6 % — ABNORMAL HIGH (ref 4.6–6.5)

## 2022-04-04 MED ORDER — RYBELSUS 14 MG PO TABS: ORAL_TABLET | ORAL | 4 refills | Status: DC

## 2022-04-04 NOTE — Progress Notes (Signed)
Subjective:  Patient ID: Levi Burns, male    DOB: Oct 03, 1975  Age: 47 y.o. MRN: [id number]  CC: Follow-up   HPI Levi Burns presents for DM f/u  Outpatient Medications Prior to Visit  Medication Sig Dispense Refill   Cholecalciferol (VITAMIN D3) 2000 units capsule Take 1 capsule (2,000 Units total) by mouth daily. 100 capsule 3   Multiple Vitamin (MULTIVITAMIN) tablet Take 1 tablet by mouth daily.     Semaglutide (RYBELSUS) 7 MG TABS Take 7 mg by mouth daily. 30 tablet 3   No facility-administered medications prior to visit.    ROS: Review of Systems  Constitutional:  Negative for appetite change, fatigue and unexpected weight change.  HENT:  Negative for congestion, nosebleeds, sneezing, sore throat and trouble swallowing.   Eyes:  Negative for itching and visual disturbance.  Respiratory:  Negative for cough.   Cardiovascular:  Negative for chest pain, palpitations and leg swelling.  Gastrointestinal:  Negative for abdominal distention, blood in stool, diarrhea and nausea.  Genitourinary:  Negative for frequency and hematuria.  Musculoskeletal:  Negative for back pain, gait problem, joint swelling and neck pain.  Skin:  Negative for rash.  Neurological:  Negative for dizziness, tremors, speech difficulty and weakness.  Psychiatric/Behavioral:  Negative for agitation, dysphoric mood and sleep disturbance. The patient is not nervous/anxious.     Objective:  BP 122/80 (BP Location: Left Arm, Patient Position: Sitting, Cuff Size: Normal)   Pulse 77   Temp 98.9 F (37.2 C) (Oral)   Ht 6' (1.829 m)   Wt 246 lb (111.6 kg)   SpO2 98%   BMI 33.36 kg/m   BP Readings from Last 3 Encounters:  04/04/22 122/80  12/27/21 122/70  09/20/21 120/80    Wt Readings from Last 3 Encounters:  04/04/22 246 lb (111.6 kg)  12/27/21 244 lb (110.7 kg)  09/20/21 260 lb (117.9 kg)    Physical Exam Constitutional:      General: He is not in acute distress.    Appearance: He is  well-developed.     Comments: NAD  Eyes:     Conjunctiva/sclera: Conjunctivae normal.     Pupils: Pupils are equal, round, and reactive to light.  Neck:     Thyroid: No thyromegaly.     Vascular: No JVD.  Cardiovascular:     Rate and Rhythm: Normal rate and regular rhythm.     Heart sounds: Normal heart sounds. No murmur heard.    No friction rub. No gallop.  Pulmonary:     Effort: Pulmonary effort is normal. No respiratory distress.     Breath sounds: Normal breath sounds. No wheezing or rales.  Chest:     Chest wall: No tenderness.  Abdominal:     General: Bowel sounds are normal. There is no distension.     Palpations: Abdomen is soft. There is no mass.     Tenderness: There is no abdominal tenderness. There is no guarding or rebound.  Musculoskeletal:        General: No tenderness. Normal range of motion.     Cervical back: Normal range of motion.  Lymphadenopathy:     Cervical: No cervical adenopathy.  Skin:    General: Skin is warm and dry.     Findings: No rash.  Neurological:     Mental Status: He is alert and oriented to person, place, and time.     Cranial Nerves: No cranial nerve deficit.     Motor: No abnormal muscle tone.  Coordination: Coordination normal.     Gait: Gait normal.     Deep Tendon Reflexes: Reflexes are normal and symmetric.  Psychiatric:        Behavior: Behavior normal.        Thought Content: Thought content normal.        Judgment: Judgment normal.     Lab Results  Component Value Date   WBC 8.0 09/20/2021   HGB 15.2 09/20/2021   HCT 45.3 09/20/2021   PLT 252.0 09/20/2021   GLUCOSE 104 (H) 09/20/2021   CHOL 164 09/20/2021   TRIG 96.0 09/20/2021   HDL 35.80 (L) 09/20/2021   LDLDIRECT 113.0 05/01/2017   LDLCALC 109 (H) 09/20/2021   ALT 30 09/20/2021   AST 22 09/20/2021   NA 141 09/20/2021   K 4.4 09/20/2021   CL 106 09/20/2021   CREATININE 1.04 09/20/2021   BUN 20 09/20/2021   CO2 23 09/20/2021   TSH 1.12 09/20/2021    PSA 0.53 09/20/2021   HGBA1C 6.7 (H) 09/20/2021    CT CARDIAC SCORING (SELF PAY ONLY)  Addendum Date: 12/19/2020   ADDENDUM REPORT: 12/19/2020 12:26 CLINICAL DATA:  Risk stratification EXAM: Coronary Calcium Score TECHNIQUE: The patient was scanned on a Siemens Somatom 64 slice scanner. Axial non-contrast 3 mm slices were carried out through the heart. The data set was analyzed on a dedicated work station and scored using the [place]. FINDINGS: Non-cardiac: See separate report from [facility]. Ascending aorta: Mild dilatation 3.9 cm Pericardium: Normal Coronary arteries: No calcium noted IMPRESSION: Coronary calcium score of 0. Jenkins Rouge Electronically Signed   By: Jenkins Rouge M.D.   On: 12/19/2020 12:26   Result Date: 12/19/2020 EXAM: OVER-READ INTERPRETATION  CT CHEST The following report is an over-read performed by radiologist Dr. Vinnie Langton of [facility], [place] on 12/14/2020. This over-read does not include interpretation of cardiac or coronary anatomy or pathology. The coronary calcium score interpretation by the cardiologist is attached. COMPARISON:  No priors. FINDINGS: Within the visualized portions of the thorax there are no suspicious appearing pulmonary nodules or masses, there is no acute consolidative airspace disease, no pleural effusions, no pneumothorax and no lymphadenopathy. Visualized portions of the upper abdomen demonstrates diffuse low attenuation throughout the visualized hepatic parenchyma, indicative of hepatic steatosis. There are no aggressive appearing lytic or blastic lesions noted in the visualized portions of the skeleton. IMPRESSION: 1. Hepatic steatosis. Electronically Signed: By: Vinnie Langton M.D. On: 12/14/2020 10:40    Assessment & Plan:   Problem List Items Addressed This Visit       Endocrine   Type 2 diabetes mellitus without complications ([place]) - Primary    Rybelsus 14 mg/d Monitor labs      Relevant Medications    Semaglutide (RYBELSUS) 14 MG TABS      Meds ordered this encounter  Medications   Semaglutide (RYBELSUS) 14 MG TABS    Sig: 1 po qd    Dispense:  30 tablet    Refill:  4      Follow-up: Return in about 3 months (around 07/04/2022) for a follow-up visit.  Walker Kehr, MD

## 2022-04-04 NOTE — Assessment & Plan Note (Addendum)
Increase Rybelsus 14 mg/d Monitor labs

## 2022-05-02 LAB — HM DIABETES EYE EXAM

## 2022-05-11 ENCOUNTER — Encounter: Payer: Self-pay | Admitting: Internal Medicine

## 2022-07-04 ENCOUNTER — Encounter: Payer: Self-pay | Admitting: Internal Medicine

## 2022-07-04 ENCOUNTER — Ambulatory Visit: Payer: 59 | Admitting: Internal Medicine

## 2022-07-04 VITALS — BP 122/84 | HR 81 | Temp 98.7°F | Ht 72.0 in | Wt 243.0 lb

## 2022-07-04 DIAGNOSIS — Z1211 Encounter for screening for malignant neoplasm of colon: Secondary | ICD-10-CM

## 2022-07-04 DIAGNOSIS — Z6835 Body mass index (BMI) 35.0-35.9, adult: Secondary | ICD-10-CM | POA: Diagnosis not present

## 2022-07-04 DIAGNOSIS — E119 Type 2 diabetes mellitus without complications: Secondary | ICD-10-CM

## 2022-07-04 DIAGNOSIS — Z0000 Encounter for general adult medical examination without abnormal findings: Secondary | ICD-10-CM

## 2022-07-04 LAB — COMPREHENSIVE METABOLIC PANEL
ALT: 26 U/L (ref 0–53)
AST: 21 U/L (ref 0–37)
Albumin: 4.3 g/dL (ref 3.5–5.2)
Alkaline Phosphatase: 53 U/L (ref 39–117)
BUN: 20 mg/dL (ref 6–23)
CO2: 26 mEq/L (ref 19–32)
Calcium: 9.1 mg/dL (ref 8.4–10.5)
Chloride: 106 mEq/L (ref 96–112)
Creatinine, Ser: 1.18 mg/dL (ref 0.40–1.50)
GFR: 73.09 mL/min (ref >60.00)
Glucose, Bld: 95 mg/dL (ref 70–99)
Potassium: 4.2 mEq/L (ref 3.5–5.1)
Sodium: 139 mEq/L (ref 135–145)
Total Bilirubin: 0.3 mg/dL (ref 0.2–1.2)
Total Protein: 6.9 g/dL (ref 6.0–8.3)

## 2022-07-04 LAB — MICROALBUMIN / CREATININE URINE RATIO
Creatinine,U: 129 mg/dL
Microalb Creat Ratio: 1.3 mg/g (ref 0.0–30.0)
Microalb, Ur: 1.7 mg/dL (ref 0.0–1.9)

## 2022-07-04 LAB — HEMOGLOBIN A1C: Hgb A1c MFr Bld: 6.4 % (ref 4.6–6.5)

## 2022-07-04 NOTE — Assessment & Plan Note (Signed)
  We discussed age appropriate health related issues, including available/recomended screening tests and vaccinations. Labs were ordered to be later reviewed . All questions were answered. We discussed one or more of the following - seat belt use, use of sunscreen/sun exposure exercise, safe sex, fall risk reduction, second hand smoke exposure, firearm use and storage, seat belt use, a need for adhering to healthy diet and exercise. Labs were ordered.  All questions were answered. Condoms HIV Discussed smoking A cardiac CT scan for coronary calcium offered 3/22

## 2022-07-04 NOTE — Assessment & Plan Note (Signed)
Cont on Rybelsus - 14 mg/d now  Pt stopped Metformin

## 2022-07-04 NOTE — Assessment & Plan Note (Signed)
Cont on Rybelsus - 14 mg/d now Check A1c

## 2022-07-04 NOTE — Progress Notes (Signed)
Subjective:  Patient ID: Levi Burns, male    DOB: 26-Dec-1975  Age: 47 y.o. MRN: [id number]  CC: Follow-up (3 [facility] F/U)   HPI Levi Burns presents for DM, HTN, obesity  Outpatient Medications Prior to Visit  Medication Sig Dispense Refill   Cholecalciferol (VITAMIN D3) 2000 units capsule Take 1 capsule (2,000 Units total) by mouth daily. 100 capsule 3   Multiple Vitamin (MULTIVITAMIN) tablet Take 1 tablet by mouth daily.     Semaglutide (RYBELSUS) 14 MG TABS 1 po qd 30 tablet 4   Semaglutide (RYBELSUS) 7 MG TABS Take 7 mg by mouth daily. 30 tablet 3   No facility-administered medications prior to visit.    ROS: Review of Systems  Constitutional:  Negative for appetite change, fatigue and unexpected weight change.  HENT:  Negative for congestion, nosebleeds, sneezing, sore throat and trouble swallowing.   Eyes:  Negative for itching and visual disturbance.  Respiratory:  Negative for cough.   Cardiovascular:  Negative for chest pain, palpitations and leg swelling.  Gastrointestinal:  Negative for abdominal distention, blood in stool, diarrhea and nausea.  Genitourinary:  Negative for frequency and hematuria.  Musculoskeletal:  Negative for back pain, gait problem, joint swelling and neck pain.  Skin:  Negative for rash.  Neurological:  Negative for dizziness, tremors, speech difficulty and weakness.  Psychiatric/Behavioral:  Negative for agitation, dysphoric mood and sleep disturbance. The patient is not nervous/anxious.     Objective:  BP 122/84 (BP Location: Left Arm, Patient Position: Sitting, Cuff Size: Large)   Pulse 81   Temp 98.7 F (37.1 C) (Oral)   Ht 6' (1.829 m)   Wt 243 lb (110.2 kg)   SpO2 99%   BMI 32.96 kg/m   BP Readings from Last 3 Encounters:  07/04/22 122/84  04/04/22 122/80  12/27/21 122/70    Wt Readings from Last 3 Encounters:  07/04/22 243 lb (110.2 kg)  04/04/22 246 lb (111.6 kg)  12/27/21 244 lb (110.7 kg)    Physical  Exam Constitutional:      General: He is not in acute distress.    Appearance: He is well-developed. He is obese.     Comments: NAD  Eyes:     Conjunctiva/sclera: Conjunctivae normal.     Pupils: Pupils are equal, round, and reactive to light.  Neck:     Thyroid: No thyromegaly.     Vascular: No JVD.  Cardiovascular:     Rate and Rhythm: Normal rate and regular rhythm.     Heart sounds: Normal heart sounds. No murmur heard.    No friction rub. No gallop.  Pulmonary:     Effort: Pulmonary effort is normal. No respiratory distress.     Breath sounds: Normal breath sounds. No wheezing or rales.  Chest:     Chest wall: No tenderness.  Abdominal:     General: Bowel sounds are normal. There is no distension.     Palpations: Abdomen is soft. There is no mass.     Tenderness: There is no abdominal tenderness. There is no guarding or rebound.  Musculoskeletal:        General: No tenderness. Normal range of motion.     Cervical back: Normal range of motion.  Lymphadenopathy:     Cervical: No cervical adenopathy.  Skin:    General: Skin is warm and dry.     Findings: No rash.  Neurological:     Mental Status: He is alert and oriented to person, place, and time.  Cranial Nerves: No cranial nerve deficit.     Motor: No abnormal muscle tone.     Coordination: Coordination normal.     Gait: Gait normal.     Deep Tendon Reflexes: Reflexes are normal and symmetric.  Psychiatric:        Behavior: Behavior normal.        Thought Content: Thought content normal.        Judgment: Judgment normal.     Lab Results  Component Value Date   WBC 8.0 09/20/2021   HGB 15.2 09/20/2021   HCT 45.3 09/20/2021   PLT 252.0 09/20/2021   GLUCOSE 108 (H) 04/04/2022   CHOL 164 09/20/2021   TRIG 96.0 09/20/2021   HDL 35.80 (L) 09/20/2021   LDLDIRECT 113.0 05/01/2017   LDLCALC 109 (H) 09/20/2021   ALT 28 04/04/2022   AST 25 04/04/2022   NA 138 04/04/2022   K 4.4 04/04/2022   CL 103 04/04/2022    CREATININE 1.12 04/04/2022   BUN 18 04/04/2022   CO2 27 04/04/2022   TSH 1.12 09/20/2021   PSA 0.53 09/20/2021   HGBA1C 6.6 (H) 04/04/2022    CT CARDIAC SCORING (SELF PAY ONLY)  Addendum Date: 12/19/2020   ADDENDUM REPORT: 12/19/2020 12:26 CLINICAL DATA:  Risk stratification EXAM: Coronary Calcium Score TECHNIQUE: The patient was scanned on a Siemens Somatom 64 slice scanner. Axial non-contrast 3 mm slices were carried out through the heart. The data set was analyzed on a dedicated work station and scored using the Agatson method. FINDINGS: Non-cardiac: See separate report from [facility]. Ascending aorta: Mild dilatation 3.9 cm Pericardium: Normal Coronary arteries: No calcium noted IMPRESSION: Coronary calcium score of 0. [name] Electronically Signed   By: [name] M.D.   On: 12/19/2020 12:26   Result Date: 12/19/2020 EXAM: OVER-READ INTERPRETATION  CT CHEST The following report is an over-read performed by radiologist Dr. [name] of [facility] on 12/14/2020. This over-read does not include interpretation of cardiac or coronary anatomy or pathology. The coronary calcium score interpretation by the cardiologist is attached. COMPARISON:  No priors. FINDINGS: Within the visualized portions of the thorax there are no suspicious appearing pulmonary nodules or masses, there is no acute consolidative airspace disease, no pleural effusions, no pneumothorax and no lymphadenopathy. Visualized portions of the upper abdomen demonstrates diffuse low attenuation throughout the visualized hepatic parenchyma, indicative of hepatic steatosis. There are no aggressive appearing lytic or blastic lesions noted in the visualized portions of the skeleton. IMPRESSION: 1. Hepatic steatosis. Electronically Signed: By: [name] M.D. On: 12/14/2020 10:40    Assessment & Plan:   Problem List Items Addressed This Visit     Well adult exam     We discussed age  appropriate health related issues, including available/recomended screening tests and vaccinations. Labs were ordered to be later reviewed . All questions were answered. We discussed one or more of the following - seat belt use, use of sunscreen/sun exposure exercise, safe sex, fall risk reduction, second hand smoke exposure, firearm use and storage, seat belt use, a need for adhering to healthy diet and exercise. Labs were ordered.  All questions were answered. Condoms HIV Discussed smoking A cardiac CT scan for coronary calcium offered 3/22      Type 2 diabetes mellitus without complications (HCC) - Primary    Cont on Rybelsus - 14 mg/d now Check A1c      Relevant Orders   Urine microalbumin-creatinine with uACR   Comprehensive metabolic  panel   Hemoglobin A1c   Obese    Cont on Rybelsus - 14 mg/d now  Pt stopped Metformin      Other Visit Diagnoses     Screening for colon cancer       Relevant Orders   Ambulatory referral to Gastroenterology         No orders of the defined types were placed in this encounter.     Follow-up: Return in about 3 months (around 10/03/2022) for Wellness Exam.  Sonda Primes, MD

## 2022-09-05 ENCOUNTER — Other Ambulatory Visit: Payer: Self-pay | Admitting: Internal Medicine

## 2022-10-03 ENCOUNTER — Ambulatory Visit (INDEPENDENT_AMBULATORY_CARE_PROVIDER_SITE_OTHER): Payer: 59 | Admitting: Internal Medicine

## 2022-10-03 ENCOUNTER — Encounter: Payer: Self-pay | Admitting: Internal Medicine

## 2022-10-03 VITALS — BP 122/80 | HR 72 | Temp 98.2°F | Ht 72.0 in | Wt 236.4 lb

## 2022-10-03 DIAGNOSIS — Z13220 Encounter for screening for lipoid disorders: Secondary | ICD-10-CM | POA: Diagnosis not present

## 2022-10-03 DIAGNOSIS — E119 Type 2 diabetes mellitus without complications: Secondary | ICD-10-CM | POA: Diagnosis not present

## 2022-10-03 DIAGNOSIS — Z125 Encounter for screening for malignant neoplasm of prostate: Secondary | ICD-10-CM | POA: Diagnosis not present

## 2022-10-03 DIAGNOSIS — Z0000 Encounter for general adult medical examination without abnormal findings: Secondary | ICD-10-CM

## 2022-10-03 DIAGNOSIS — Z7985 Long-term (current) use of injectable non-insulin antidiabetic drugs: Secondary | ICD-10-CM

## 2022-10-03 LAB — COMPREHENSIVE METABOLIC PANEL
ALT: 27 U/L (ref 0–53)
AST: 19 U/L (ref 0–37)
Albumin: 4.7 g/dL (ref 3.5–5.2)
Alkaline Phosphatase: 60 U/L (ref 39–117)
BUN: 18 mg/dL (ref 6–23)
CO2: 27 mEq/L (ref 19–32)
Calcium: 9.7 mg/dL (ref 8.4–10.5)
Chloride: 103 mEq/L (ref 96–112)
Creatinine, Ser: 1.14 mg/dL (ref 0.40–1.50)
GFR: 76.05 mL/min (ref >60.00)
Glucose, Bld: 93 mg/dL (ref 70–99)
Potassium: 4.5 mEq/L (ref 3.5–5.1)
Sodium: 139 mEq/L (ref 135–145)
Total Bilirubin: 0.5 mg/dL (ref 0.2–1.2)
Total Protein: 7.1 g/dL (ref 6.0–8.3)

## 2022-10-03 LAB — CBC WITH DIFFERENTIAL/PLATELET
Basophils Absolute: 0.1 10*3/uL (ref 0.0–0.1)
Basophils Relative: 0.8 % (ref 0.0–3.0)
Eosinophils Absolute: 0.3 10*3/uL (ref 0.0–0.7)
Eosinophils Relative: 4 % (ref 0.0–5.0)
HCT: 46.9 % (ref 39.0–52.0)
Hemoglobin: 15.5 g/dL (ref 13.0–17.0)
Lymphocytes Relative: 30.4 % (ref 12.0–46.0)
Lymphs Abs: 2.1 10*3/uL (ref 0.7–4.0)
MCHC: 33 g/dL (ref 30.0–36.0)
MCV: 79.6 fl (ref 78.0–100.0)
Monocytes Absolute: 0.5 10*3/uL (ref 0.1–1.0)
Monocytes Relative: 7.1 % (ref 3.0–12.0)
Neutro Abs: 4.1 10*3/uL (ref 1.4–7.7)
Neutrophils Relative %: 57.7 % (ref 43.0–77.0)
Platelets: 245 10*3/uL (ref 150.0–400.0)
RBC: 5.89 Mil/uL — ABNORMAL HIGH (ref 4.22–5.81)
RDW: 13.4 % (ref 11.5–15.5)
WBC: 7.1 10*3/uL (ref 4.0–10.5)

## 2022-10-03 LAB — URINALYSIS
Bilirubin Urine: NEGATIVE
Hgb urine dipstick: NEGATIVE
Ketones, ur: NEGATIVE
Leukocytes,Ua: NEGATIVE
Nitrite: NEGATIVE
Specific Gravity, Urine: 1.01 (ref 1.000–1.030)
Total Protein, Urine: NEGATIVE
Urine Glucose: NEGATIVE
Urobilinogen, UA: 0.2 (ref 0.0–1.0)
pH: 6 (ref 5.0–8.0)

## 2022-10-03 LAB — PSA: PSA: 0.48 ng/mL (ref 0.10–4.00)

## 2022-10-03 LAB — LIPID PANEL
Cholesterol: 187 mg/dL (ref 0–200)
HDL: 47.9 mg/dL (ref >39.00)
LDL Cholesterol: 124 mg/dL — ABNORMAL HIGH (ref 0–99)
NonHDL: 139.55
Total CHOL/HDL Ratio: 4
Triglycerides: 77 mg/dL (ref 0.0–149.0)
VLDL: 15.4 mg/dL (ref 0.0–40.0)

## 2022-10-03 LAB — TSH: TSH: 1.9 u[IU]/mL (ref 0.35–5.50)

## 2022-10-03 LAB — HEMOGLOBIN A1C: Hgb A1c MFr Bld: 6.1 % (ref 4.6–6.5)

## 2022-10-03 MED ORDER — RYBELSUS 14 MG PO TABS: ORAL_TABLET | ORAL | 3 refills | Status: DC

## 2022-10-03 NOTE — Assessment & Plan Note (Addendum)
Rybelsus - on 14 mg/d now A cardiac CT scan for coronary calcium is 0 in 2022

## 2022-10-03 NOTE — Assessment & Plan Note (Addendum)
We discussed age appropriate health related issues, including available/recomended screening tests and vaccinations. Labs were ordered to be later reviewed . All questions were answered. We discussed one or more of the following - seat belt use, use of sunscreen/sun exposure exercise, safe sex, fall risk reduction, second hand smoke exposure, firearm use and storage, seat belt use, a need for adhering to healthy diet and exercise. Labs were ordered.  All questions were answered. Condoms HIV Discussed smoking Jaems will see Dr Marina Goodell for a colonoscopy in 2024 A cardiac CT scan for coronary calcium is 0 in 2022

## 2022-10-03 NOTE — Progress Notes (Signed)
Subjective:  Patient ID: Levi Burns, male    DOB: Jan 22, 1976  Age: 47 y.o. MRN: [id number]  CC: Annual Exam   HPI Levi Burns presents for a well exam   Outpatient Medications Prior to Visit  Medication Sig Dispense Refill   Cholecalciferol (VITAMIN D3) 2000 units capsule Take 1 capsule (2,000 Units total) by mouth daily. 100 capsule 3   Multiple Vitamin (MULTIVITAMIN) tablet Take 1 tablet by mouth daily.     Semaglutide (RYBELSUS) 14 MG TABS TAKE 1 TABLET BY MOUTH EVERY DAY 30 tablet 4   No facility-administered medications prior to visit.    ROS: Review of Systems  Constitutional:  Negative for appetite change, fatigue and unexpected weight change.  HENT:  Negative for congestion, nosebleeds, sneezing, sore throat and trouble swallowing.   Eyes:  Negative for itching and visual disturbance.  Respiratory:  Negative for cough.   Cardiovascular:  Negative for chest pain, palpitations and leg swelling.  Gastrointestinal:  Negative for abdominal distention, blood in stool, diarrhea and nausea.  Genitourinary:  Negative for frequency and hematuria.  Musculoskeletal:  Negative for back pain, gait problem, joint swelling and neck pain.  Skin:  Negative for rash.  Neurological:  Negative for dizziness, tremors, speech difficulty and weakness.  Psychiatric/Behavioral:  Negative for agitation, dysphoric mood and sleep disturbance. The patient is not nervous/anxious.     Objective:  BP 122/80   Pulse 72   Temp 98.2 F (36.8 C) (Oral)   Ht 6' (1.829 m)   Wt 236 lb 6 oz (107.2 kg)   SpO2 99%   BMI 32.06 kg/m   BP Readings from Last 3 Encounters:  10/03/22 122/80  07/04/22 122/84  04/04/22 122/80    Wt Readings from Last 3 Encounters:  10/03/22 236 lb 6 oz (107.2 kg)  07/04/22 243 lb (110.2 kg)  04/04/22 246 lb (111.6 kg)    Physical Exam Constitutional:      General: He is not in acute distress.    Appearance: He is well-developed.     Comments: NAD  Eyes:      Conjunctiva/sclera: Conjunctivae normal.     Pupils: Pupils are equal, round, and reactive to light.  Neck:     Thyroid: No thyromegaly.     Vascular: No JVD.  Cardiovascular:     Rate and Rhythm: Normal rate and regular rhythm.     Heart sounds: Normal heart sounds. No murmur heard.    No friction rub. No gallop.  Pulmonary:     Effort: Pulmonary effort is normal. No respiratory distress.     Breath sounds: Normal breath sounds. No wheezing or rales.  Chest:     Chest wall: No tenderness.  Abdominal:     General: Bowel sounds are normal. There is no distension.     Palpations: Abdomen is soft. There is no mass.     Tenderness: There is no abdominal tenderness. There is no guarding or rebound.  Musculoskeletal:        General: No tenderness. Normal range of motion.     Cervical back: Normal range of motion.  Lymphadenopathy:     Cervical: No cervical adenopathy.  Skin:    General: Skin is warm and dry.     Findings: No rash.  Neurological:     Mental Status: He is alert and oriented to person, place, and time.     Cranial Nerves: No cranial nerve deficit.     Motor: No abnormal muscle tone.  Coordination: Coordination normal.     Gait: Gait normal.     Deep Tendon Reflexes: Reflexes are normal and symmetric.  Psychiatric:        Behavior: Behavior normal.        Thought Content: Thought content normal.        Judgment: Judgment normal.   Rectal per GI  Lab Results  Component Value Date   WBC 8.0 09/20/2021   HGB 15.2 09/20/2021   HCT 45.3 09/20/2021   PLT 252.0 09/20/2021   GLUCOSE 95 07/04/2022   CHOL 164 09/20/2021   TRIG 96.0 09/20/2021   HDL 35.80 (L) 09/20/2021   LDLDIRECT 113.0 05/01/2017   LDLCALC 109 (H) 09/20/2021   ALT 26 07/04/2022   AST 21 07/04/2022   NA 139 07/04/2022   K 4.2 07/04/2022   CL 106 07/04/2022   CREATININE 1.18 07/04/2022   BUN 20 07/04/2022   CO2 26 07/04/2022   TSH 1.12 09/20/2021   PSA 0.53 09/20/2021   HGBA1C 6.4  07/04/2022   MICROALBUR 1.7 07/04/2022    CT CARDIAC SCORING (SELF PAY ONLY)  Addendum Date: 12/19/2020   ADDENDUM REPORT: 12/19/2020 12:26 CLINICAL DATA:  Risk stratification EXAM: Coronary Calcium Score TECHNIQUE: The patient was scanned on a Siemens Somatom 64 slice scanner. Axial non-contrast 3 mm slices were carried out through the heart. The data set was analyzed on a dedicated work station and scored using the Levi Burns method. FINDINGS: Non-cardiac: See separate report from Levi Burns. Ascending aorta: Mild dilatation 3.9 cm Pericardium: Normal Coronary arteries: No calcium noted IMPRESSION: Coronary calcium score of 0. Levi Burns Electronically Signed   By: Levi Burns M.D.   On: 12/19/2020 12:26   Result Date: 12/19/2020 EXAM: OVER-READ INTERPRETATION  CT CHEST The following report is an over-read performed by radiologist Dr. Trudie Burns of [facility] on 12/14/2020. This over-read does not include interpretation of cardiac or coronary anatomy or pathology. The coronary calcium score interpretation by the cardiologist is attached. COMPARISON:  No priors. FINDINGS: Within the visualized portions of the thorax there are no suspicious appearing pulmonary nodules or masses, there is no acute consolidative airspace disease, no pleural effusions, no pneumothorax and no lymphadenopathy. Visualized portions of the upper abdomen demonstrates diffuse low attenuation throughout the visualized hepatic parenchyma, indicative of hepatic steatosis. There are no aggressive appearing lytic or blastic lesions noted in the visualized portions of the skeleton. IMPRESSION: 1. Hepatic steatosis. Electronically Signed: By: Levi Burns M.D. On: 12/14/2020 10:40    Assessment & Plan:   Problem List Items Addressed This Visit     Well adult exam - Primary    We discussed age appropriate health related issues, including available/recomended screening tests and vaccinations. Labs  were ordered to be later reviewed . All questions were answered. We discussed one or more of the following - seat belt use, use of sunscreen/sun exposure exercise, safe sex, fall risk reduction, second hand smoke exposure, firearm use and storage, seat belt use, a need for adhering to healthy diet and exercise. Labs were ordered.  All questions were answered. Condoms HIV Discussed smoking Soryn will see Dr Marina Goodell for a colonoscopy in 2024 A cardiac CT scan for coronary calcium is 0 in 2022      Relevant Orders   TSH   Urinalysis   CBC with Differential/Platelet   Lipid panel   PSA   Comprehensive metabolic panel   Hemoglobin A1c   Type 2 diabetes mellitus without complications (HCC)  Rybelsus - on 14 mg/d now A cardiac CT scan for coronary calcium is 0 in 2022      Relevant Medications   Semaglutide (RYBELSUS) 14 MG TABS   Other Relevant Orders   Hemoglobin A1c      Meds ordered this encounter  Medications   Semaglutide (RYBELSUS) 14 MG TABS    Sig: TAKE 1 TABLET BY MOUTH EVERY DAY    Dispense:  90 tablet    Refill:  3      Follow-up: Return in about 6 months (around 04/05/2023) for a follow-up visit.  Sonda Primes, MD

## 2022-11-02 ENCOUNTER — Encounter: Payer: Self-pay | Admitting: Internal Medicine

## 2022-12-12 ENCOUNTER — Ambulatory Visit (AMBULATORY_SURGERY_CENTER): Payer: 59 | Admitting: *Deleted

## 2022-12-12 VITALS — Ht 72.0 in | Wt 234.0 lb

## 2022-12-12 DIAGNOSIS — Z1211 Encounter for screening for malignant neoplasm of colon: Secondary | ICD-10-CM

## 2022-12-12 MED ORDER — NA SULFATE-K SULFATE-MG SULF 17.5-3.13-1.6 GM/177ML PO SOLN
1.0000 | Freq: Once | ORAL | 0 refills | Status: AC
Start: 2022-12-12 — End: 2022-12-12

## 2022-12-12 NOTE — Progress Notes (Signed)
Pt's name and DOB verified at the beginning of the pre-visit wit 2 identifiers  Pt denies any difficulty with ambulating,sitting, laying down or rolling side to side  Gave both [facility] main # and MD on call # prior to instructions.   No egg or soy allergy known to patient   Patient denies ever being intubated  Pt has no issues moving head neck or swallowing  No FH of Malignant Hyperthermia  Pt is not on diet pills or shots  Pt is not on home 02   Pt is not on blood thinners   Pt denies issues with constipation   Pt is not on dialysis  Pt denise any abnormal heart rhythms   Pt denies any upcoming cardiac testing  Pt encouraged to use to use Singlecare or Goodrx to reduce cost   Patient's chart reviewed by Cathlyn Parsons CNRA prior to pre-visit and patient appropriate for the [facility].  Pre-visit completed and red dot placed by patient's name on their procedure day (on provider's schedule).  .  Visit by phone  Pt states weight is 234 lb  Instructed pt why it is important to and  to call if they have any changes in health or new medications. Directed them to the # given and on instructions.     Instructions reviewed with pt and pt states understanding. Instructed to review again prior to procedure. Pt states they will.   Instructions sent by mail with coupon and by my chart   lvb

## 2022-12-20 ENCOUNTER — Encounter: Payer: Self-pay | Admitting: Internal Medicine

## 2023-01-02 ENCOUNTER — Encounter: Payer: Self-pay | Admitting: Internal Medicine

## 2023-01-02 ENCOUNTER — Ambulatory Visit (AMBULATORY_SURGERY_CENTER): Payer: 59 | Admitting: Internal Medicine

## 2023-01-02 VITALS — BP 103/55 | HR 76 | Temp 99.3°F | Resp 15 | Ht 72.0 in | Wt 234.0 lb

## 2023-01-02 DIAGNOSIS — Z1211 Encounter for screening for malignant neoplasm of colon: Secondary | ICD-10-CM

## 2023-01-02 MED ORDER — SODIUM CHLORIDE 0.9 % IV SOLN: 500.0000 mL | Freq: Once | INTRAVENOUS | Status: DC

## 2023-01-02 NOTE — Progress Notes (Signed)
HISTORY OF PRESENT ILLNESS:  Levi Burns is a 47 y.o. male sent today for routine screening colonoscopy.  No complaints  REVIEW OF SYSTEMS:  All non-GI ROS negative except for  Past Medical History:  Diagnosis Date   Asthma    Diabetes mellitus without complication [facility])     Past Surgical History:  Procedure Laterality Date   WISDOM TOOTH EXTRACTION  03/2019    Social History Vipul Mccleaf  reports that he has been smoking cigarettes. He has a 11 pack-year smoking history. He has never used smokeless tobacco. He reports current alcohol use. He reports that he does not use drugs.  family history includes Colon polyps in his mother; Hypertension in his father and mother.  Allergies  Allergen Reactions   Metformin And Related     diarrhea       PHYSICAL EXAMINATION:  Vital signs: BP (!) 151/93   Pulse 76   Temp 99.3 F (37.4 C)   Resp 11   Ht 6' (1.829 m)   Wt 234 lb (106.1 kg)   SpO2 100%   BMI 31.74 kg/m  General: Well-developed, well-nourished, no acute distress HEENT: Sclerae are anicteric, conjunctiva pink. Oral mucosa intact Lungs: Clear Heart: Regular Abdomen: soft, nontender, nondistended, no obvious ascites, no peritoneal signs, normal bowel sounds. No organomegaly. Extremities: No edema Psychiatric: alert and oriented x3. Cooperative    ASSESSMENT:   Colon cancer screening  PLAN:  Screening colonoscopy

## 2023-01-02 NOTE — Patient Instructions (Signed)
Resume previous diet and medications. Repeat Colonoscopy in 10 years for surveillance.  YOU HAD AN ENDOSCOPIC PROCEDURE TODAY AT THE [facility]:   Refer to the procedure report that was given to you for any specific questions about what was found during the examination.  If the procedure report does not answer your questions, please call your gastroenterologist to clarify.  If you requested that your care partner not be given the details of your procedure findings, then the procedure report has been included in a sealed envelope for you to review at your convenience later.  YOU SHOULD EXPECT: Some feelings of bloating in the abdomen. Passage of more gas than usual.  Walking can help get rid of the air that was put into your GI tract during the procedure and reduce the bloating. If you had a lower endoscopy (such as a colonoscopy or flexible sigmoidoscopy) you may notice spotting of blood in your stool or on the toilet paper. If you underwent a bowel prep for your procedure, you may not have a normal bowel movement for a few days.  Please Note:  You might notice some irritation and congestion in your nose or some drainage.  This is from the oxygen used during your procedure.  There is no need for concern and it should clear up in a day or so.  SYMPTOMS TO REPORT IMMEDIATELY:  Following lower endoscopy (colonoscopy or flexible sigmoidoscopy):  Excessive amounts of blood in the stool  Significant tenderness or worsening of abdominal pains  Swelling of the abdomen that is new, acute  Fever of 100F or higher  For urgent or emergent issues, a gastroenterologist can be reached at any hour by calling [phone]. Do not use MyChart messaging for urgent concerns.    DIET:  We do recommend a small meal at first, but then you may proceed to your regular diet.  Drink plenty of fluids but you should avoid alcoholic beverages for 24 hours.  ACTIVITY:  You should plan to take it easy for the  rest of today and you should NOT DRIVE or use heavy machinery until tomorrow (because of the sedation medicines used during the test).    FOLLOW UP: Our staff will call the number listed on your records the next business day following your procedure.  We will call around 7:15- 8:00 am to check on you and address any questions or concerns that you may have regarding the information given to you following your procedure. If we do not reach you, we will leave a message.     If any biopsies were taken you will be contacted by phone or by letter within the next 1-3 weeks.  Please call us at [phone] if you have not heard about the biopsies in 3 weeks.    SIGNATURES/CONFIDENTIALITY: You and/or your care partner have signed paperwork which will be entered into your electronic medical record.  These signatures attest to the fact that that the information above on your After Visit Summary has been reviewed and is understood.  Full responsibility of the confidentiality of this discharge information lies with you and/or your care-partner.  

## 2023-01-02 NOTE — Op Note (Signed)
[facility] Patient Name: Levi Burns Procedure Date: 01/02/2023 9:35 AM MRN: [id number] Endoscopist: Wilhemina Bonito. Marina Goodell , MD, [id number] Age: 47 Referring MD:  Date of Birth: [birth year] Gender: Male Account #: [id number] Procedure:                Colonoscopy Indications:              Screening for colorectal malignant neoplasm Medicines:                Monitored Anesthesia Care Procedure:                Pre-Anesthesia Assessment:                           - Prior to the procedure, a History and Physical                            was performed, and patient medications and                            allergies were reviewed. The patient's tolerance of                            previous anesthesia was also reviewed. The risks                            and benefits of the procedure and the sedation                            options and risks were discussed with the patient.                            All questions were answered, and informed consent                            was obtained. Prior Anticoagulants: The patient has                            taken no anticoagulant or antiplatelet agents.                            After reviewing the risks and benefits, the patient                            was deemed in satisfactory condition to undergo the                            procedure.                           After obtaining informed consent, the colonoscope                            was passed under direct vision. Throughout the  procedure, the patient's blood pressure, pulse, and                            oxygen saturations were monitored continuously. The                            CF [id number] #[id number] was introduced through the anus                            and advanced to the the cecum, identified by                            appendiceal orifice and ileocecal valve. The                            ileocecal valve, appendiceal orifice, and  rectum                            were photographed. The quality of the bowel                            preparation was excellent. The colonoscopy was                            performed without difficulty. The patient tolerated                            the procedure well. The bowel preparation used was                            SUPREP via split dose instruction. Scope In: 9:43:32 AM Scope Out: 9:55:17 AM Scope Withdrawal Time: 0 hours 9 minutes 24 seconds  Total Procedure Duration: 0 hours 11 minutes 45 seconds  Findings:                 A few diverticula were found in the sigmoid colon.                           Internal hemorrhoids were found during retroflexion.                           The exam was otherwise without abnormality on                            direct and retroflexion views. Complications:            No immediate complications. Estimated blood loss:                            None. Estimated Blood Loss:     Estimated blood loss: none. Impression:               - Diverticulosis in the sigmoid colon.                           - Internal  hemorrhoids.                           - The examination was otherwise normal on direct                            and retroflexion views.                           - No specimens collected. Recommendation:           - Repeat colonoscopy in 10 years for screening                            purposes.                           - Patient has a contact number available for                            emergencies. The signs and symptoms of potential                            delayed complications were discussed with the                            patient. Return to normal activities tomorrow.                            Written discharge instructions were provided to the                            patient.                           - Resume previous diet.                           - Continue present medications. Wilhemina Bonito. Marina Goodell,  MD 01/02/2023 9:59:31 AM This report has been signed electronically.

## 2023-01-02 NOTE — Progress Notes (Signed)
Report to PACU, RN, vss, BBS= Clear.  

## 2023-01-02 NOTE — Progress Notes (Signed)
Pt's states no medical or surgical changes since previsit or office visit. 

## 2023-01-03 ENCOUNTER — Telehealth: Payer: Self-pay | Admitting: *Deleted

## 2023-01-03 NOTE — Telephone Encounter (Signed)
  Follow up Call-     01/02/2023    9:15 AM  Call back number  Post procedure Call Back phone  # [phone]  Permission to leave phone message Yes     Patient questions:  Do you have a fever, pain , or abdominal swelling? No. Pain Score  0 *  Have you tolerated food without any problems? Yes.    Have you been able to return to your normal activities? Yes.    Do you have any questions about your discharge instructions: Diet   No. Medications  No. Follow up visit  No.  Do you have questions or concerns about your Care? No.  Actions: * If pain score is 4 or above: No action needed, pain <4.

## 2023-04-03 ENCOUNTER — Ambulatory Visit: Payer: 59 | Admitting: Internal Medicine

## 2023-04-17 ENCOUNTER — Ambulatory Visit: Payer: 59 | Admitting: Internal Medicine

## 2023-04-17 ENCOUNTER — Encounter: Payer: Self-pay | Admitting: Internal Medicine

## 2023-04-17 VITALS — BP 122/70 | HR 98 | Temp 98.3°F | Ht 72.0 in | Wt 249.0 lb

## 2023-04-17 DIAGNOSIS — Z0000 Encounter for general adult medical examination without abnormal findings: Secondary | ICD-10-CM

## 2023-04-17 DIAGNOSIS — E739 Lactose intolerance, unspecified: Secondary | ICD-10-CM

## 2023-04-17 DIAGNOSIS — R635 Abnormal weight gain: Secondary | ICD-10-CM

## 2023-04-17 DIAGNOSIS — E119 Type 2 diabetes mellitus without complications: Secondary | ICD-10-CM

## 2023-04-17 DIAGNOSIS — Z7984 Long term (current) use of oral hypoglycemic drugs: Secondary | ICD-10-CM

## 2023-04-17 DIAGNOSIS — Z23 Encounter for immunization: Secondary | ICD-10-CM

## 2023-04-17 DIAGNOSIS — F17200 Nicotine dependence, unspecified, uncomplicated: Secondary | ICD-10-CM

## 2023-04-17 DIAGNOSIS — R030 Elevated blood-pressure reading, without diagnosis of hypertension: Secondary | ICD-10-CM

## 2023-04-17 LAB — COMPREHENSIVE METABOLIC PANEL
ALT: 32 U/L (ref 0–53)
AST: 21 U/L (ref 0–37)
Albumin: 4.3 g/dL (ref 3.5–5.2)
Alkaline Phosphatase: 58 U/L (ref 39–117)
BUN: 18 mg/dL (ref 6–23)
CO2: 26 meq/L (ref 19–32)
Calcium: 9.2 mg/dL (ref 8.4–10.5)
Chloride: 104 meq/L (ref 96–112)
Creatinine, Ser: 1.07 mg/dL (ref 0.40–1.50)
GFR: 81.75 mL/min (ref >60.00)
Glucose, Bld: 93 mg/dL (ref 70–99)
Potassium: 4.4 meq/L (ref 3.5–5.1)
Sodium: 138 meq/L (ref 135–145)
Total Bilirubin: 0.4 mg/dL (ref 0.2–1.2)
Total Protein: 7.1 g/dL (ref 6.0–8.3)

## 2023-04-17 LAB — HEMOGLOBIN A1C: Hgb A1c MFr Bld: 6.2 % (ref 4.6–6.5)

## 2023-04-17 MED ORDER — RYBELSUS 14 MG PO TABS: ORAL_TABLET | ORAL | 3 refills | Status: DC

## 2023-04-17 NOTE — Assessment & Plan Note (Signed)
 Nl BP after wt loss A cardiac CT scan score is 0 in 2022

## 2023-04-17 NOTE — Progress Notes (Signed)
 Subjective:  Patient ID: Levi Burns, male    DOB: [birth year]  Age: 48 y.o. MRN: [id number]  CC: Medical Management of Chronic Issues (6 mnth f/u)   HPI Lebarron Wilensky presents for DM  Outpatient Medications Prior to Visit  Medication Sig Dispense Refill   Ascorbic Acid (VITAMIN C) 1000 MG tablet Take 1,000 mg by mouth daily.     Cholecalciferol (VITAMIN D3) 2000 units capsule Take 1 capsule (2,000 Units total) by mouth daily. 100 capsule 3   Garlic 500 MG CAPS Take by mouth.     Multiple Vitamin (MULTIVITAMIN) tablet Take 1 tablet by mouth daily.     Semaglutide  (RYBELSUS ) 14 MG TABS TAKE 1 TABLET BY MOUTH EVERY DAY 90 tablet 3   No facility-administered medications prior to visit.    ROS: Review of Systems  Constitutional:  Positive for unexpected weight change. Negative for appetite change and fatigue.  HENT:  Negative for congestion, nosebleeds, sneezing, sore throat and trouble swallowing.   Eyes:  Negative for itching and visual disturbance.  Respiratory:  Negative for cough.   Cardiovascular:  Negative for chest pain, palpitations and leg swelling.  Gastrointestinal:  Negative for abdominal distention, blood in stool, diarrhea and nausea.  Genitourinary:  Negative for frequency and hematuria.  Musculoskeletal:  Negative for back pain, gait problem, joint swelling and neck pain.  Skin:  Negative for rash.  Neurological:  Negative for dizziness, tremors, speech difficulty and weakness.  Psychiatric/Behavioral:  Negative for agitation, dysphoric mood and sleep disturbance. The patient is not nervous/anxious.     Objective:  BP 122/70 (BP Location: Left Arm, Patient Position: Sitting, Cuff Size: Normal)   Pulse 98   Temp 98.3 F (36.8 C) (Oral)   Ht 6' (1.829 m)   Wt 249 lb (112.9 kg)   SpO2 98%   BMI 33.77 kg/m   BP Readings from Last 3 Encounters:  04/17/23 122/70  01/02/23 (!) 103/55  10/03/22 122/80    Wt Readings from Last 3 Encounters:  04/17/23 249 lb  (112.9 kg)  01/02/23 234 lb (106.1 kg)  12/12/22 234 lb (106.1 kg)    Physical Exam Constitutional:      General: He is not in acute distress.    Appearance: He is well-developed. He is obese.     Comments: NAD  Eyes:     Conjunctiva/sclera: Conjunctivae normal.     Pupils: Pupils are equal, round, and reactive to light.  Neck:     Thyroid : No thyromegaly.     Vascular: No JVD.  Cardiovascular:     Rate and Rhythm: Normal rate and regular rhythm.     Heart sounds: Normal heart sounds. No murmur heard.    No friction rub. No gallop.  Pulmonary:     Effort: Pulmonary effort is normal. No respiratory distress.     Breath sounds: Normal breath sounds. No wheezing or rales.  Chest:     Chest wall: No tenderness.  Abdominal:     General: Bowel sounds are normal. There is no distension.     Palpations: Abdomen is soft. There is no mass.     Tenderness: There is no abdominal tenderness. There is no guarding or rebound.  Musculoskeletal:        General: No tenderness. Normal range of motion.     Cervical back: Normal range of motion.  Lymphadenopathy:     Cervical: No cervical adenopathy.  Skin:    General: Skin is warm and dry.  Findings: No rash.  Neurological:     Mental Status: He is alert and oriented to person, place, and time.     Cranial Nerves: No cranial nerve deficit.     Motor: No abnormal muscle tone.     Coordination: Coordination normal.     Gait: Gait normal.     Deep Tendon Reflexes: Reflexes are normal and symmetric.  Psychiatric:        Behavior: Behavior normal.        Thought Content: Thought content normal.        Judgment: Judgment normal.     Lab Results  Component Value Date   WBC 7.1 10/03/2022   HGB 15.5 10/03/2022   HCT 46.9 10/03/2022   PLT 245.0 10/03/2022   GLUCOSE 93 10/03/2022   CHOL 187 10/03/2022   TRIG 77.0 10/03/2022   HDL 47.90 10/03/2022   LDLDIRECT 113.0 05/01/2017   LDLCALC 124 (H) 10/03/2022   ALT 27 10/03/2022   AST  19 10/03/2022   NA 139 10/03/2022   K 4.5 10/03/2022   CL 103 10/03/2022   CREATININE 1.14 10/03/2022   BUN 18 10/03/2022   CO2 27 10/03/2022   TSH 1.90 10/03/2022   PSA 0.48 10/03/2022   HGBA1C 6.1 10/03/2022   MICROALBUR 1.7 07/04/2022    CT CARDIAC SCORING (SELF PAY ONLY) Addendum Date: 12/19/2020 ADDENDUM REPORT: 12/19/2020 12:26 CLINICAL DATA:  Risk stratification EXAM: Coronary Calcium Score TECHNIQUE: The patient was scanned on a Siemens Somatom 64 slice scanner. Axial non-contrast 3 mm slices were carried out through the heart. The data set was analyzed on a dedicated work station and scored using the Agatson method. FINDINGS: Non-cardiac: See separate report from [name]. Ascending aorta: Mild dilatation 3.9 cm Pericardium: Normal Coronary arteries: No calcium noted IMPRESSION: Coronary calcium score of 0. Janelle Mediate Electronically Signed   By: Janelle Mediate M.D.   On: 12/19/2020 12:26   Result Date: 12/19/2020 EXAM: OVER-READ INTERPRETATION  CT CHEST The following report is an over-read performed by radiologist Dr. Alexandria Angel of [facility] on 12/14/2020. This over-read does not include interpretation of cardiac or coronary anatomy or pathology. The coronary calcium score interpretation by the cardiologist is attached. COMPARISON:  No priors. FINDINGS: Within the visualized portions of the thorax there are no suspicious appearing pulmonary nodules or masses, there is no acute consolidative airspace disease, no pleural effusions, no pneumothorax and no lymphadenopathy. Visualized portions of the upper abdomen demonstrates diffuse low attenuation throughout the visualized hepatic parenchyma, indicative of hepatic steatosis. There are no aggressive appearing lytic or blastic lesions noted in the visualized portions of the skeleton. IMPRESSION: 1. Hepatic steatosis. Electronically Signed: By: Alexandria Angel M.D. 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 A cardiac CT scan for coronary calcium is 0 in 2022      Relevant Medications   Semaglutide  (RYBELSUS ) 14 MG TABS   Other Relevant Orders   Comprehensive metabolic panel   Hemoglobin A1c   Elevated BP without diagnosis of hypertension   Nl BP after wt loss A cardiac CT scan score is  0 in 2022      Weight gain   [employer] walking      Other Visit Diagnoses       Need for influenza vaccination    -  Primary   Relevant Orders   Flu vaccine trivalent PF, 6mos and older(Flulaval,Afluria,Fluarix,Fluzone) (Completed)         Meds ordered this encounter  Medications   Semaglutide  (RYBELSUS ) 14 MG TABS    Sig: TAKE 1 TABLET BY MOUTH EVERY DAY    Dispense:  90 tablet    Refill:  3      Follow-up: Return in about 6 months (around 10/15/2023) for Wellness Exam.  Anitra Barn, MD

## 2023-04-17 NOTE — Assessment & Plan Note (Addendum)
  A cardiac CT scan score is 0 in 2022

## 2023-04-17 NOTE — Assessment & Plan Note (Signed)
 Lactose free diet

## 2023-04-17 NOTE — Assessment & Plan Note (Signed)
Rybelsus - on 14 mg/d now A cardiac CT scan for coronary calcium is 0 in 2022

## 2023-04-17 NOTE — Assessment & Plan Note (Addendum)
 New Start walking

## 2023-04-19 ENCOUNTER — Encounter: Payer: Self-pay | Admitting: Internal Medicine

## 2023-08-02 IMAGING — CT CT CARDIAC CORONARY ARTERY CALCIUM SCORE
3 series · 14 of 20 positions shown, 16 images · non-contrast
Comparison: No priors.
COMPARISON: No priors.

Addendum:
EXAM:
OVER-READ INTERPRETATION  CT CHEST

The following report is an over-read performed by radiologist Dr.
Lizzie Narang [REDACTED] on 12/14/2020. This
over-read does not include interpretation of cardiac or coronary
anatomy or pathology. The coronary calcium score interpretation by
the cardiologist is attached.
CLINICAL DATA: Risk stratification
Coronary Calcium Score
TECHNIQUE: The patient was scanned on a Siemens Somatom 64 slice scanner. Axial
non-contrast 3 mm slices were carried out through the heart. The
data set was analyzed on a dedicated work station and scored using
the Agatson method.

[Series 2: cascseq 2.0 sa36 70% (id) · axial · 0.47mm/px · z∈[-314,-234]mm · 4 of 68 slices shown]
[im 14/68  vessel]
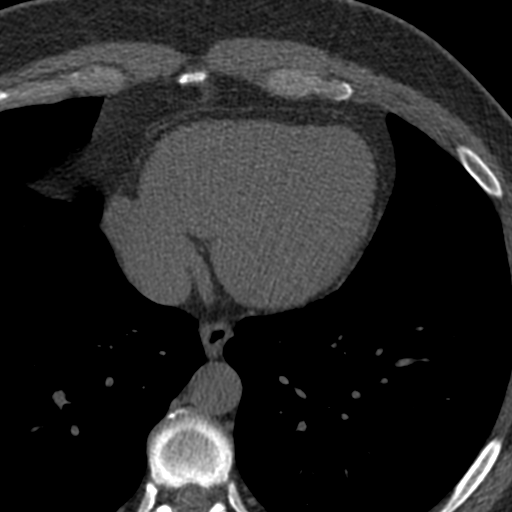
[im 27/68  vessel]
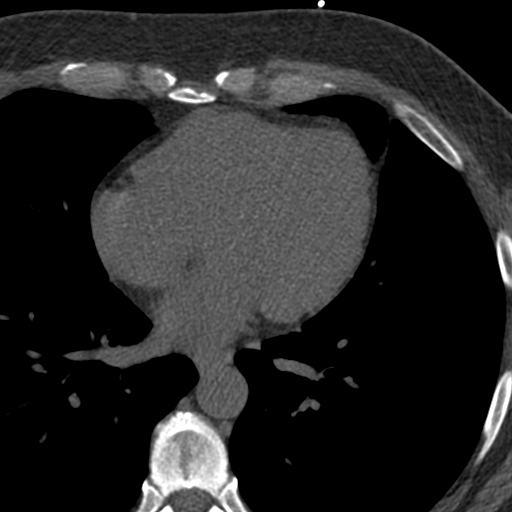
[im 41/68  vessel]
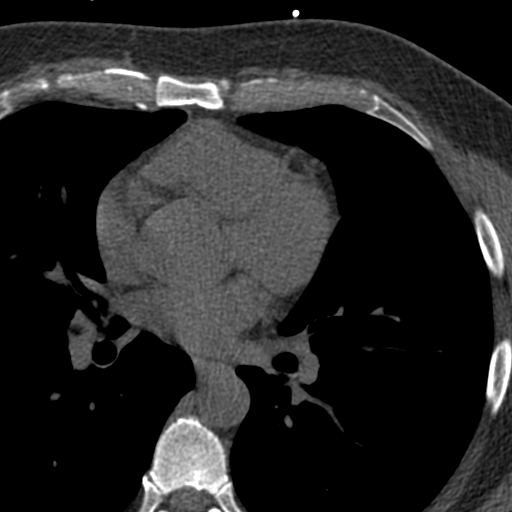
[im 54/68  vessel]
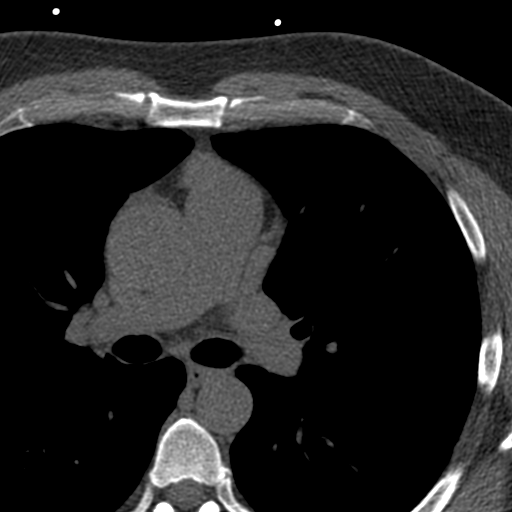

[Series 3: cascseq 2.0 bf37 st · axial · 0.77mm/px · z∈[-318,-230]mm · 5 of 68 slices shown, 7 images]
[im 12/68  vessel]
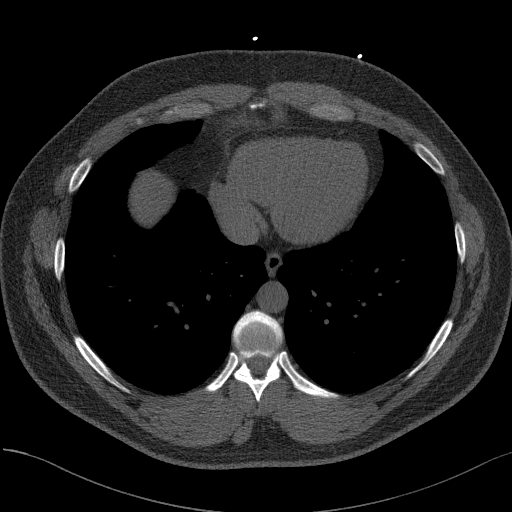
[im 12/68  lung]
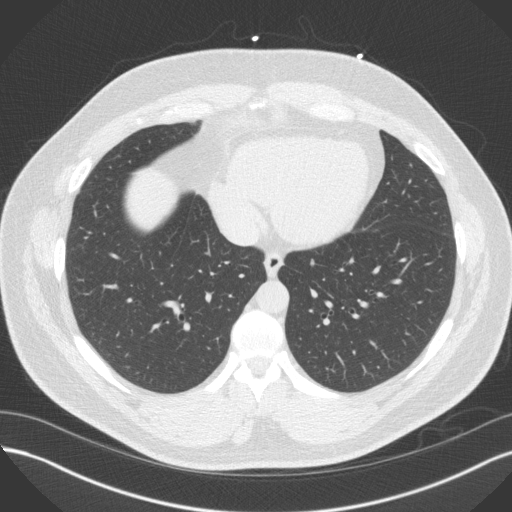
[im 23/68  vessel]
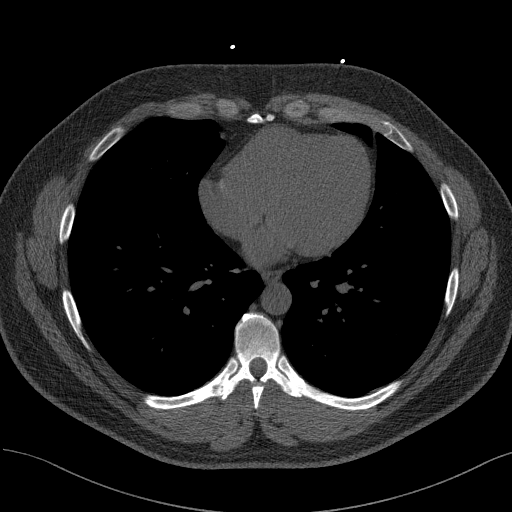
[im 34/68  vessel]
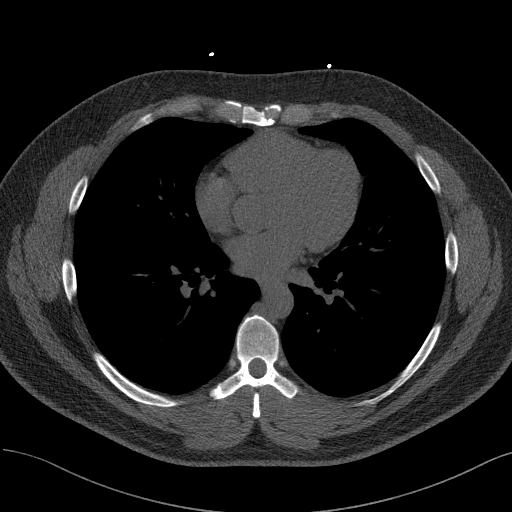
[im 45/68  vessel]
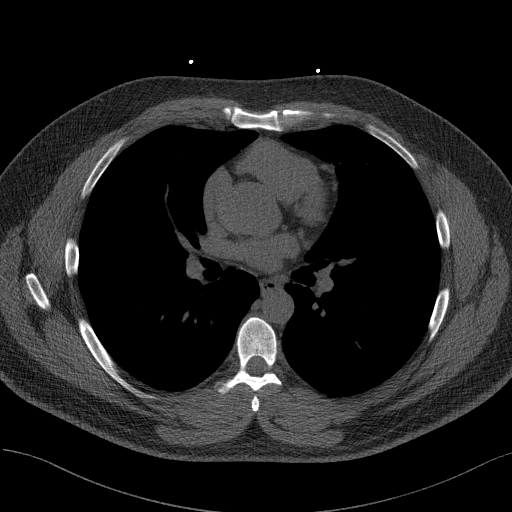
[im 56/68  vessel]
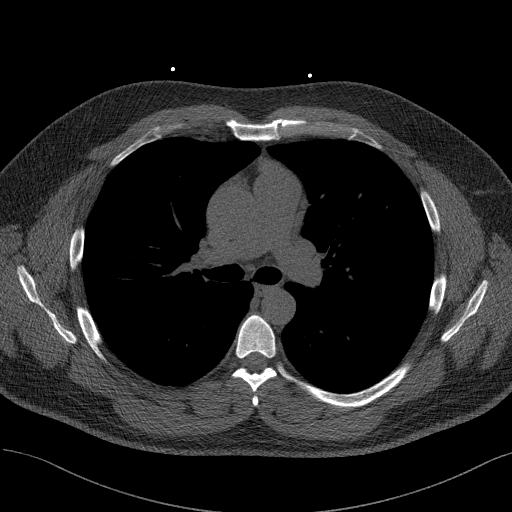
[im 56/68  lung]
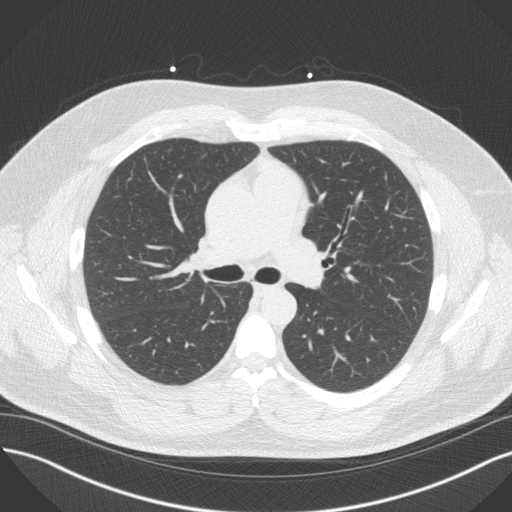

[Series 4: cascseq 2.0 br59 lung · axial · 0.77mm/px · z∈[-318,-230]mm · 5 of 68 slices shown]
[im 12/68  lung]
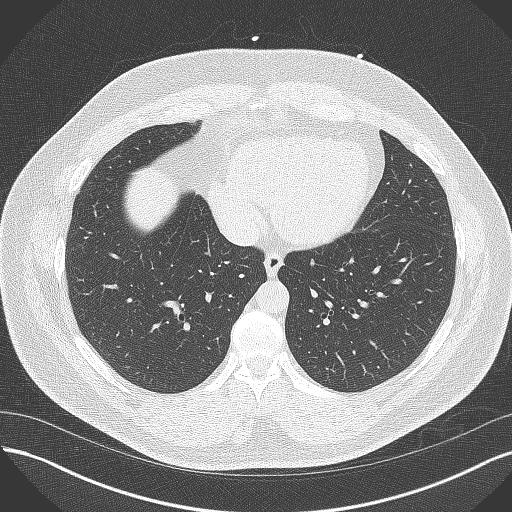
[im 23/68  lung]
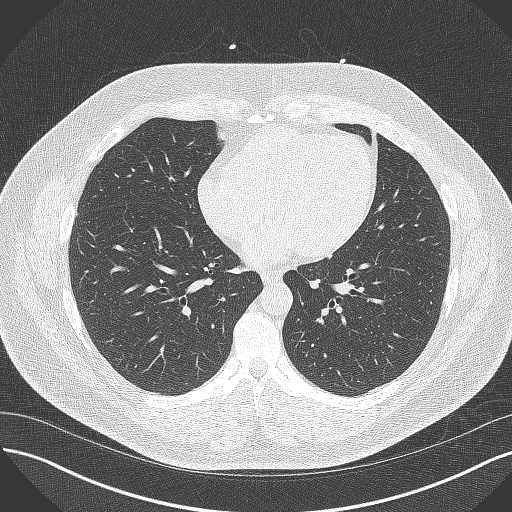
[im 34/68  lung]
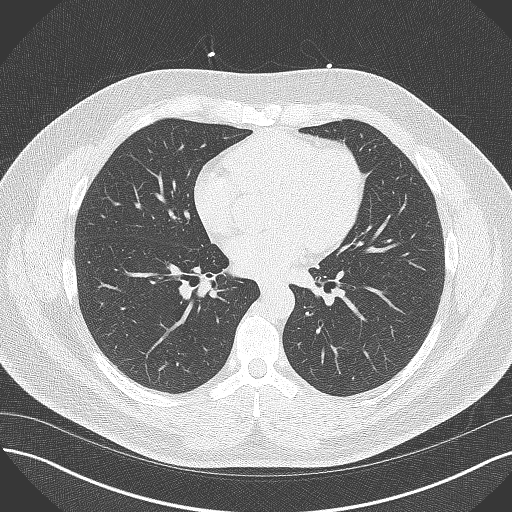
[im 45/68  lung]
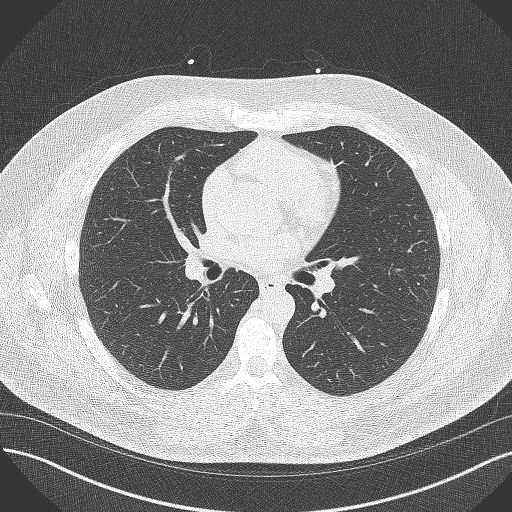
[im 56/68  lung]
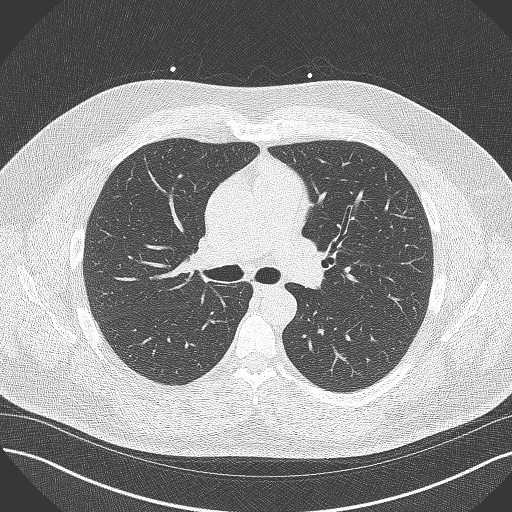

[14 of 20 positions shown; findings below may reference images not displayed]

FINDINGS: Within the visualized portions of the thorax there are no suspicious
appearing pulmonary nodules or masses, there is no acute
consolidative airspace disease, no pleural effusions, no
pneumothorax and no lymphadenopathy. Visualized portions of the
upper abdomen demonstrates diffuse low attenuation throughout the
visualized hepatic parenchyma, indicative of hepatic steatosis.
There are no aggressive appearing lytic or blastic lesions noted in
the visualized portions of the skeleton.
IMPRESSION: 1. Hepatic steatosis.
FINDINGS: Non-cardiac: See separate report from [REDACTED].

Ascending aorta: Mild dilatation 3.9 cm

Pericardium: Normal

Coronary arteries: No calcium noted
IMPRESSION: Coronary calcium score of 0.

Jie Bonnett

*** End of Addendum ***
EXAM:
OVER-READ INTERPRETATION  CT CHEST

The following report is an over-read performed by radiologist Dr.
Lizzie Narang [REDACTED] on 12/14/2020. This
over-read does not include interpretation of cardiac or coronary
anatomy or pathology. The coronary calcium score interpretation by
the cardiologist is attached.
FINDINGS: Within the visualized portions of the thorax there are no suspicious
appearing pulmonary nodules or masses, there is no acute
consolidative airspace disease, no pleural effusions, no
pneumothorax and no lymphadenopathy. Visualized portions of the
upper abdomen demonstrates diffuse low attenuation throughout the
visualized hepatic parenchyma, indicative of hepatic steatosis.
There are no aggressive appearing lytic or blastic lesions noted in
the visualized portions of the skeleton.
IMPRESSION: 1. Hepatic steatosis.

## 2023-10-16 ENCOUNTER — Encounter: Payer: Self-pay | Admitting: Internal Medicine

## 2023-10-16 ENCOUNTER — Ambulatory Visit (INDEPENDENT_AMBULATORY_CARE_PROVIDER_SITE_OTHER): Payer: 59 | Admitting: Internal Medicine

## 2023-10-16 VITALS — BP 130/80 | HR 69 | Temp 98.3°F | Ht 72.0 in | Wt 236.0 lb

## 2023-10-16 DIAGNOSIS — Z7984 Long term (current) use of oral hypoglycemic drugs: Secondary | ICD-10-CM

## 2023-10-16 DIAGNOSIS — E119 Type 2 diabetes mellitus without complications: Secondary | ICD-10-CM | POA: Diagnosis not present

## 2023-10-16 DIAGNOSIS — Z0000 Encounter for general adult medical examination without abnormal findings: Secondary | ICD-10-CM | POA: Diagnosis not present

## 2023-10-16 LAB — URINALYSIS
Bilirubin Urine: NEGATIVE
Hgb urine dipstick: NEGATIVE
Ketones, ur: NEGATIVE
Leukocytes,Ua: NEGATIVE
Nitrite: NEGATIVE
Specific Gravity, Urine: <=1.005 — AB (ref 1.000–1.030)
Total Protein, Urine: NEGATIVE
Urine Glucose: NEGATIVE
Urobilinogen, UA: 0.2 (ref 0.0–1.0)
pH: 6 (ref 5.0–8.0)

## 2023-10-16 LAB — COMPREHENSIVE METABOLIC PANEL WITH GFR
ALT: 31 U/L (ref 0–53)
AST: 23 U/L (ref 0–37)
Albumin: 4.7 g/dL (ref 3.5–5.2)
Alkaline Phosphatase: 64 U/L (ref 39–117)
BUN: 16 mg/dL (ref 6–23)
CO2: 28 meq/L (ref 19–32)
Calcium: 9.2 mg/dL (ref 8.4–10.5)
Chloride: 101 meq/L (ref 96–112)
Creatinine, Ser: 1.11 mg/dL (ref 0.40–1.50)
GFR: 77.95 mL/min (ref >60.00)
Glucose, Bld: 88 mg/dL (ref 70–99)
Potassium: 4.4 meq/L (ref 3.5–5.1)
Sodium: 139 meq/L (ref 135–145)
Total Bilirubin: 0.5 mg/dL (ref 0.2–1.2)
Total Protein: 6.9 g/dL (ref 6.0–8.3)

## 2023-10-16 LAB — CBC WITH DIFFERENTIAL/PLATELET
Basophils Absolute: 0.1 K/uL (ref 0.0–0.1)
Basophils Relative: 0.9 % (ref 0.0–3.0)
Eosinophils Absolute: 0.4 K/uL (ref 0.0–0.7)
Eosinophils Relative: 5.4 % — ABNORMAL HIGH (ref 0.0–5.0)
HCT: 46.3 % (ref 39.0–52.0)
Hemoglobin: 15.5 g/dL (ref 13.0–17.0)
Lymphocytes Relative: 29.6 % (ref 12.0–46.0)
Lymphs Abs: 2.3 K/uL (ref 0.7–4.0)
MCHC: 33.5 g/dL (ref 30.0–36.0)
MCV: 79.1 fl (ref 78.0–100.0)
Monocytes Absolute: 0.6 K/uL (ref 0.1–1.0)
Monocytes Relative: 7.9 % (ref 3.0–12.0)
Neutro Abs: 4.4 K/uL (ref 1.4–7.7)
Neutrophils Relative %: 56.2 % (ref 43.0–77.0)
Platelets: 227 K/uL (ref 150.0–400.0)
RBC: 5.86 Mil/uL — ABNORMAL HIGH (ref 4.22–5.81)
RDW: 13.4 % (ref 11.5–15.5)
WBC: 7.8 K/uL (ref 4.0–10.5)

## 2023-10-16 LAB — LIPID PANEL
Cholesterol: 195 mg/dL (ref 0–200)
HDL: 48.1 mg/dL (ref >39.00)
LDL Cholesterol: 130 mg/dL — ABNORMAL HIGH (ref 0–99)
NonHDL: 146.66
Total CHOL/HDL Ratio: 4
Triglycerides: 84 mg/dL (ref 0.0–149.0)
VLDL: 16.8 mg/dL (ref 0.0–40.0)

## 2023-10-16 LAB — PSA: PSA: 0.55 ng/mL (ref 0.10–4.00)

## 2023-10-16 LAB — TSH: TSH: 1.53 u[IU]/mL (ref 0.35–5.50)

## 2023-10-16 LAB — MICROALBUMIN / CREATININE URINE RATIO
Creatinine,U: 32.3 mg/dL
Microalb Creat Ratio: UNDETERMINED mg/g (ref 0.0–30.0)
Microalb, Ur: <0.7 mg/dL

## 2023-10-16 LAB — HEMOGLOBIN A1C: Hgb A1c MFr Bld: 6.3 % (ref 4.6–6.5)

## 2023-10-16 MED ORDER — RYBELSUS 14 MG PO TABS: ORAL_TABLET | ORAL | 3 refills | Status: AC

## 2023-10-16 NOTE — Progress Notes (Signed)
 Subjective:  Patient ID: Levi Burns, male    DOB: 1975-04-30  Age: 48 y.o. MRN: [id number]  CC: Annual Exam   HPI Tzion Wedel presents for a well exam  Outpatient Medications Prior to Visit  Medication Sig Dispense Refill   Ascorbic Acid (VITAMIN C) 1000 MG tablet Take 1,000 mg by mouth daily.     Cholecalciferol (VITAMIN D3) 2000 units capsule Take 1 capsule (2,000 Units total) by mouth daily. 100 capsule 3   Garlic 500 MG CAPS Take by mouth.     Multiple Vitamin (MULTIVITAMIN) tablet Take 1 tablet by mouth daily.     Semaglutide  (RYBELSUS ) 14 MG TABS TAKE 1 TABLET BY MOUTH EVERY DAY 90 tablet 3   No facility-administered medications prior to visit.    ROS: Review of Systems  Constitutional:  Negative for appetite change, fatigue and unexpected weight change.  HENT:  Negative for congestion, nosebleeds, sneezing, sore throat and trouble swallowing.   Eyes:  Negative for itching and visual disturbance.  Respiratory:  Negative for cough.   Cardiovascular:  Negative for chest pain, palpitations and leg swelling.  Gastrointestinal:  Negative for abdominal distention, blood in stool, diarrhea and nausea.  Genitourinary:  Negative for frequency and hematuria.  Musculoskeletal:  Negative for back pain, gait problem, joint swelling and neck pain.  Skin:  Negative for rash.  Neurological:  Negative for dizziness, tremors, speech difficulty and weakness.  Psychiatric/Behavioral:  Negative for agitation, dysphoric mood and sleep disturbance. The patient is not nervous/anxious.     Objective:  BP 130/80   Pulse 69   Temp 98.3 F (36.8 C) (Oral)   Ht 6' (1.829 m)   Wt 236 lb (107 kg)   SpO2 100%   BMI 32.01 kg/m   BP Readings from Last 3 Encounters:  10/16/23 130/80  04/17/23 122/70  01/02/23 (!) 103/55    Wt Readings from Last 3 Encounters:  10/16/23 236 lb (107 kg)  04/17/23 249 lb (112.9 kg)  01/02/23 234 lb (106.1 kg)    Physical Exam Constitutional:      General:  He is not in acute distress.    Appearance: He is well-developed.     Comments: NAD  Eyes:     Conjunctiva/sclera: Conjunctivae normal.     Pupils: Pupils are equal, round, and reactive to light.  Neck:     Thyroid : No thyromegaly.     Vascular: No JVD.  Cardiovascular:     Rate and Rhythm: Normal rate and regular rhythm.     Heart sounds: Normal heart sounds. No murmur heard.    No friction rub. No gallop.  Pulmonary:     Effort: Pulmonary effort is normal. No respiratory distress.     Breath sounds: Normal breath sounds. No wheezing or rales.  Chest:     Chest wall: No tenderness.  Abdominal:     General: Bowel sounds are normal. There is no distension.     Palpations: Abdomen is soft. There is no mass.     Tenderness: There is no abdominal tenderness. There is no guarding or rebound.  Musculoskeletal:        General: No tenderness. Normal range of motion.     Cervical back: Normal range of motion.  Lymphadenopathy:     Cervical: No cervical adenopathy.  Skin:    General: Skin is warm and dry.     Findings: No rash.  Neurological:     Mental Status: He is alert and oriented to person, place,  and time.     Cranial Nerves: No cranial nerve deficit.     Motor: No abnormal muscle tone.     Coordination: Coordination normal.     Gait: Gait normal.     Deep Tendon Reflexes: Reflexes are normal and symmetric.  Psychiatric:        Behavior: Behavior normal.        Thought Content: Thought content normal.        Judgment: Judgment normal.   Recta - done by GI  Lab Results  Component Value Date   WBC 7.1 10/03/2022   HGB 15.5 10/03/2022   HCT 46.9 10/03/2022   PLT 245.0 10/03/2022   GLUCOSE 93 04/17/2023   CHOL 187 10/03/2022   TRIG 77.0 10/03/2022   HDL 47.90 10/03/2022   LDLDIRECT 113.0 05/01/2017   LDLCALC 124 (H) 10/03/2022   ALT 32 04/17/2023   AST 21 04/17/2023   NA 138 04/17/2023   K 4.4 04/17/2023   CL 104 04/17/2023   CREATININE 1.07 04/17/2023   BUN 18  04/17/2023   CO2 26 04/17/2023   TSH 1.90 10/03/2022   PSA 0.48 10/03/2022   HGBA1C 6.2 04/17/2023    CT CARDIAC SCORING (SELF PAY ONLY) Addendum Date: 12/19/2020 ADDENDUM REPORT: 12/19/2020 12:26 CLINICAL DATA:  Risk stratification EXAM: Coronary Calcium Score TECHNIQUE: The patient was scanned on a Siemens Somatom 64 slice scanner. Axial non-contrast 3 mm slices were carried out through the heart. The data set was analyzed on a dedicated work station and scored using the Agatson method. FINDINGS: Non-cardiac: See separate report from [facility]. Ascending aorta: Mild dilatation 3.9 cm Pericardium: Normal Coronary arteries: No calcium noted IMPRESSION: Coronary calcium score of 0. Maude Emmer Electronically Signed   By: Maude Emmer M.D.   On: 12/19/2020 12:26   Result Date: 12/19/2020 EXAM: OVER-READ INTERPRETATION  CT CHEST The following report is an over-read performed by radiologist Dr. Toribio Aye of [facility] on 12/14/2020. This over-read does not include interpretation of cardiac or coronary anatomy or pathology. The coronary calcium score interpretation by the cardiologist is attached. COMPARISON:  No priors. FINDINGS: Within the visualized portions of the thorax there are no suspicious appearing pulmonary nodules or masses, there is no acute consolidative airspace disease, no pleural effusions, no pneumothorax and no lymphadenopathy. Visualized portions of the upper abdomen demonstrates diffuse low attenuation throughout the visualized hepatic parenchyma, indicative of hepatic steatosis. There are no aggressive appearing lytic or blastic lesions noted in the visualized portions of the skeleton. IMPRESSION: 1. Hepatic steatosis. Electronically Signed: By: Toribio Aye M.D. 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 Labs were ordered to be later reviewed . All questions were answered. We discussed one or more of the following - seat belt use, use of sunscreen/sun exposure exercise, safe sex, fall risk reduction, second hand smoke exposure, firearm use and storage, seat belt use, a need for adhering to healthy diet and exercise. Labs were  ordered.  All questions were answered. Condoms HIV Discussed smoking Clem saw Dr Abran for a colonoscopy in 2024, next is due in 2034 A cardiac CT scan for coronary calcium is 0 in 2022      Relevant Orders   TSH   Urinalysis   CBC with Differential/Platelet   Lipid panel   Comprehensive metabolic panel with GFR   PSA   HIV Antibody (routine testing w rflx)   Microalbumin / creatinine urine ratio   Hemoglobin A1c      Meds ordered this encounter  Medications   Semaglutide  (RYBELSUS ) 14 MG TABS    Sig: TAKE 1 TABLET BY MOUTH EVERY DAY    Dispense:  90 tablet    Refill:  3      Follow-up: Return in about 3 months (around 01/16/2024).  Marolyn Noel, MD

## 2023-10-16 NOTE — Assessment & Plan Note (Signed)
Rybelsus - on 14 mg/d now A cardiac CT scan for coronary calcium is 0 in 2022

## 2023-10-16 NOTE — Assessment & Plan Note (Signed)
 We discussed age appropriate health related issues, including available/recomended screening tests and vaccinations. Labs were ordered to be later reviewed . All questions were answered. We discussed one or more of the following - seat belt use, use of sunscreen/sun exposure exercise, safe sex, fall risk reduction, second hand smoke exposure, firearm use and storage, seat belt use, a need for adhering to healthy diet and exercise. Labs were ordered.  All questions were answered. Condoms HIV Discussed smoking Parish saw Dr Abran for a colonoscopy in 2024, next is due in 2034 A cardiac CT scan for coronary calcium is 0 in 2022

## 2023-10-17 ENCOUNTER — Ambulatory Visit: Payer: Self-pay | Admitting: Internal Medicine

## 2023-10-17 LAB — HIV ANTIBODY (ROUTINE TESTING W REFLEX): HIV 1&2 Ab, 4th Generation: NONREACTIVE

## 2023-10-23 ENCOUNTER — Encounter: Payer: Self-pay | Admitting: Internal Medicine

## 2023-10-23 LAB — HM DIABETES EYE EXAM

## 2024-01-22 ENCOUNTER — Encounter: Payer: Self-pay | Admitting: Internal Medicine

## 2024-01-22 ENCOUNTER — Ambulatory Visit: Admitting: Internal Medicine

## 2024-01-22 VITALS — BP 134/80 | HR 73 | Temp 98.2°F | Ht 72.0 in | Wt 235.4 lb

## 2024-01-22 DIAGNOSIS — F17200 Nicotine dependence, unspecified, uncomplicated: Secondary | ICD-10-CM | POA: Diagnosis not present

## 2024-01-22 DIAGNOSIS — Z23 Encounter for immunization: Secondary | ICD-10-CM | POA: Diagnosis not present

## 2024-01-22 DIAGNOSIS — Z6835 Body mass index (BMI) 35.0-35.9, adult: Secondary | ICD-10-CM

## 2024-01-22 DIAGNOSIS — Z7984 Long term (current) use of oral hypoglycemic drugs: Secondary | ICD-10-CM

## 2024-01-22 DIAGNOSIS — E119 Type 2 diabetes mellitus without complications: Secondary | ICD-10-CM

## 2024-01-22 DIAGNOSIS — E66812 Obesity, class 2: Secondary | ICD-10-CM

## 2024-01-22 NOTE — Assessment & Plan Note (Signed)
 Doing better.

## 2024-01-22 NOTE — Progress Notes (Signed)
 Subjective:  Patient ID: Levi Burns, male    DOB: [birth year]  Age: 48 y.o. MRN: [id number]  CC: Medical Management of Chronic Issues (3 Month follow up)   HPI Levi Burns presents for HTN, DM, obesity  Outpatient Medications Prior to Visit  Medication Sig Dispense Refill   Ascorbic Acid (VITAMIN C) 1000 MG tablet Take 1,000 mg by mouth daily.     Cholecalciferol (VITAMIN D3) 2000 units capsule Take 1 capsule (2,000 Units total) by mouth daily. 100 capsule 3   Garlic 500 MG CAPS Take by mouth.     Multiple Vitamin (MULTIVITAMIN) tablet Take 1 tablet by mouth daily.     Semaglutide  (RYBELSUS ) 14 MG TABS TAKE 1 TABLET BY MOUTH EVERY DAY 90 tablet 3   No facility-administered medications prior to visit.    ROS: Review of Systems  Constitutional:  Negative for appetite change, fatigue and unexpected weight change.  HENT:  Negative for congestion, nosebleeds, sneezing, sore throat and trouble swallowing.   Eyes:  Negative for itching and visual disturbance.  Respiratory:  Negative for cough.   Cardiovascular:  Negative for chest pain, palpitations and leg swelling.  Gastrointestinal:  Negative for abdominal distention, blood in stool, diarrhea and nausea.  Genitourinary:  Negative for frequency and hematuria.  Musculoskeletal:  Negative for back pain, gait problem, joint swelling and neck pain.  Skin:  Negative for rash.  Neurological:  Negative for dizziness, tremors, speech difficulty and weakness.  Psychiatric/Behavioral:  Negative for agitation, dysphoric mood and sleep disturbance. The patient is not nervous/anxious.     Objective:  BP 134/80   Pulse 73   Temp 98.2 F (36.8 C)   Ht 6' (1.829 m)   Wt 235 lb 6.4 oz (106.8 kg)   SpO2 98%   BMI 31.93 kg/m   BP Readings from Last 3 Encounters:  01/22/24 134/80  10/16/23 130/80  04/17/23 122/70    Wt Readings from Last 3 Encounters:  01/22/24 235 lb 6.4 oz (106.8 kg)  10/16/23 236 lb (107 kg)  04/17/23 249 lb (112.9  kg)    Physical Exam Constitutional:      General: He is not in acute distress.    Appearance: He is well-developed.     Comments: NAD  Eyes:     Conjunctiva/sclera: Conjunctivae normal.     Pupils: Pupils are equal, round, and reactive to light.  Neck:     Thyroid : No thyromegaly.     Vascular: No JVD.  Cardiovascular:     Rate and Rhythm: Normal rate and regular rhythm.     Heart sounds: Normal heart sounds. No murmur heard.    No friction rub. No gallop.  Pulmonary:     Effort: Pulmonary effort is normal. No respiratory distress.     Breath sounds: Normal breath sounds. No wheezing or rales.  Chest:     Chest wall: No tenderness.  Abdominal:     General: Bowel sounds are normal. There is no distension.     Palpations: Abdomen is soft. There is no mass.     Tenderness: There is no abdominal tenderness. There is no guarding or rebound.  Musculoskeletal:        General: No tenderness. Normal range of motion.     Cervical back: Normal range of motion.     Right lower leg: No edema.     Left lower leg: No edema.  Lymphadenopathy:     Cervical: No cervical adenopathy.  Skin:    General: Skin  is warm and dry.     Findings: No rash.  Neurological:     Mental Status: He is alert and oriented to person, place, and time.     Cranial Nerves: No cranial nerve deficit.     Motor: No abnormal muscle tone.     Coordination: Coordination normal.     Gait: Gait normal.     Deep Tendon Reflexes: Reflexes are normal and symmetric.  Psychiatric:        Behavior: Behavior normal.        Thought Content: Thought content normal.        Judgment: Judgment normal.     Lab Results  Component Value Date   WBC 7.8 10/16/2023   HGB 15.5 10/16/2023   HCT 46.3 10/16/2023   PLT 227.0 10/16/2023   GLUCOSE 88 10/16/2023   CHOL 195 10/16/2023   TRIG 84.0 10/16/2023   HDL 48.10 10/16/2023   LDLDIRECT 113.0 05/01/2017   LDLCALC 130 (H) 10/16/2023   ALT 31 10/16/2023   AST 23 10/16/2023    NA 139 10/16/2023   K 4.4 10/16/2023   CL 101 10/16/2023   CREATININE 1.11 10/16/2023   BUN 16 10/16/2023   CO2 28 10/16/2023   TSH 1.53 10/16/2023   PSA 0.55 10/16/2023   HGBA1C 6.3 10/16/2023   MICROALBUR <0.7 10/16/2023    CT CARDIAC SCORING (SELF PAY ONLY) Addendum Date: 12/19/2020 ADDENDUM REPORT: 12/19/2020 12:26 CLINICAL DATA:  Risk stratification EXAM: Coronary Calcium Score TECHNIQUE: The patient was scanned on a Siemens Somatom 64 slice scanner. Axial non-contrast 3 mm slices were carried out through the heart. The data set was analyzed on a dedicated work station and scored using the Agatson method. FINDINGS: Non-cardiac: See separate report from [facility]. Ascending aorta: Mild dilatation 3.9 cm Pericardium: Normal Coronary arteries: No calcium noted IMPRESSION: Coronary calcium score of 0. Maude Emmer Electronically Signed   By: Maude Emmer M.D.   On: 12/19/2020 12:26   Result Date: 12/19/2020 EXAM: OVER-READ INTERPRETATION  CT CHEST The following report is an over-read performed by radiologist Dr. Toribio Aye of [facility] on 12/14/2020. This over-read does not include interpretation of cardiac or coronary anatomy or pathology. The coronary calcium score interpretation by the cardiologist is attached. COMPARISON:  No priors. FINDINGS: Within the visualized portions of the thorax there are no suspicious appearing pulmonary nodules or masses, there is no acute consolidative airspace disease, no pleural effusions, no pneumothorax and no lymphadenopathy. Visualized portions of the upper abdomen demonstrates diffuse low attenuation throughout the visualized hepatic parenchyma, indicative of hepatic steatosis. There are no aggressive appearing lytic or blastic lesions noted in the visualized portions of the skeleton. IMPRESSION: 1. Hepatic steatosis. Electronically Signed: By: Toribio Aye M.D. On: 12/14/2020 10:40    Assessment & Plan:   Problem  List Items Addressed This Visit     Obese   Doing better      Tobacco use disorder    A cardiac CT scan score is 0 in 2022      Relevant Orders   Hemoglobin A1c   Comprehensive metabolic panel with GFR   Type 2 diabetes mellitus without complications [facility]) - Primary   New onset March 2022 Hemoglobin A1c is 7.7%.  Start Metformin  500 mg once a day 4/23 Re-start Metformin  500 mg once a day d/c Rybelsus  Rx given Pt lost wt on Rybelsus  7 mg/d, diet - On 14 mg/d now Rybelsus  - on 14 mg/d now A cardiac CT  scan for coronary calcium is 0 in 2022      Relevant Orders   Hemoglobin A1c   Comprehensive metabolic panel with GFR      No orders of the defined types were placed in this encounter.     Follow-up: Return in about 3 months (around 04/23/2024) for a follow-up visit.  Marolyn Noel, MD

## 2024-01-22 NOTE — Addendum Note (Signed)
 Addended byBETHA LUCETTA CLEATRICE LELON on: 01/22/2024 11:41 AM   Modules accepted: Orders

## 2024-01-22 NOTE — Assessment & Plan Note (Signed)
  A cardiac CT scan score is 0 in 2022

## 2024-01-22 NOTE — Assessment & Plan Note (Signed)
 New onset March 2022 Hemoglobin A1c is 7.7%.  Start Metformin  500 mg once a day 4/23 Re-start Metformin  500 mg once a day d/c Rybelsus  Rx given Pt lost wt on Rybelsus  7 mg/d, diet - On 14 mg/d now Rybelsus  - on 14 mg/d now A cardiac CT scan for coronary calcium is 0 in 2022

## 2024-04-29 ENCOUNTER — Ambulatory Visit: Admitting: Internal Medicine
